# Patient Record
Sex: Male | Born: 1950 | Race: Black or African American | Hispanic: No | Marital: Married
Health system: Southern US, Community
[De-identification: ages and names within clinical notes are randomized; demographics above are authoritative.]

---

## 2009-02-22 ENCOUNTER — Ambulatory Visit: Payer: Self-pay | Admitting: Internal Medicine

## 2009-02-22 ENCOUNTER — Ambulatory Visit: Payer: Self-pay | Admitting: Family Medicine

## 2009-02-22 ENCOUNTER — Inpatient Hospital Stay (HOSPITAL_COMMUNITY): Admission: EM | Admit: 2009-02-22 | Discharge: 2009-03-01 | Payer: Self-pay | Admitting: Emergency Medicine

## 2009-02-23 ENCOUNTER — Ambulatory Visit: Payer: Self-pay | Admitting: Physical Medicine & Rehabilitation

## 2009-02-23 ENCOUNTER — Encounter (INDEPENDENT_AMBULATORY_CARE_PROVIDER_SITE_OTHER): Payer: Self-pay | Admitting: Emergency Medicine

## 2009-02-28 ENCOUNTER — Encounter: Payer: Self-pay | Admitting: Cardiovascular Disease

## 2009-03-01 ENCOUNTER — Ambulatory Visit: Payer: Self-pay | Admitting: Cardiovascular Disease

## 2009-03-01 ENCOUNTER — Inpatient Hospital Stay (HOSPITAL_COMMUNITY)
Admission: RE | Admit: 2009-03-01 | Discharge: 2009-03-15 | Payer: Self-pay | Admitting: Physical Medicine & Rehabilitation

## 2009-03-01 ENCOUNTER — Ambulatory Visit: Payer: Self-pay | Admitting: Physical Medicine & Rehabilitation

## 2009-04-12 ENCOUNTER — Telehealth: Payer: Self-pay | Admitting: Family Medicine

## 2009-04-19 ENCOUNTER — Encounter
Admission: RE | Admit: 2009-04-19 | Discharge: 2009-04-19 | Payer: Self-pay | Admitting: Physical Medicine & Rehabilitation

## 2010-11-23 ENCOUNTER — Ambulatory Visit: Payer: Self-pay | Admitting: Radiation Oncology

## 2010-11-29 ENCOUNTER — Ambulatory Visit: Payer: Self-pay | Admitting: Radiation Oncology

## 2010-12-06 ENCOUNTER — Ambulatory Visit: Payer: Self-pay | Admitting: Radiation Oncology

## 2011-01-06 ENCOUNTER — Ambulatory Visit: Payer: Self-pay | Admitting: Radiation Oncology

## 2011-01-14 LAB — BASIC METABOLIC PANEL
BUN: 17 mg/dL (ref 6–23)
Creatinine, Ser: 1.09 mg/dL (ref 0.4–1.5)
GFR calc non Af Amer: >60 mL/min (ref >60)

## 2011-01-15 LAB — DIFFERENTIAL
Basophils Absolute: 0 10*3/uL (ref 0.0–0.1)
Basophils Absolute: 0.1 10*3/uL (ref 0.0–0.1)
Basophils Relative: 1 % (ref 0–1)
Eosinophils Absolute: 0.1 10*3/uL (ref 0.0–0.7)
Eosinophils Absolute: 0.4 10*3/uL (ref 0.0–0.7)
Eosinophils Relative: 6 % — ABNORMAL HIGH (ref 0–5)
Lymphocytes Relative: 28 % (ref 12–46)
Lymphocytes Relative: 40 % (ref 12–46)
Lymphs Abs: 1.9 10*3/uL (ref 0.7–4.0)
Monocytes Absolute: 0.8 10*3/uL (ref 0.1–1.0)
Neutrophils Relative %: 60 % (ref 43–77)

## 2011-01-15 LAB — CARDIAC PANEL(CRET KIN+CKTOT+MB+TROPI)
CK, MB: 1.6 ng/mL (ref 0.3–4.0)
CK, MB: 1.9 ng/mL (ref 0.3–4.0)
CK, MB: 2 ng/mL (ref 0.3–4.0)
Relative Index: INVALID (ref 0.0–2.5)
Relative Index: INVALID (ref 0.0–2.5)
Relative Index: INVALID (ref 0.0–2.5)
Relative Index: INVALID (ref 0.0–2.5)
Total CK: 47 U/L (ref 7–232)
Total CK: 55 U/L (ref 7–232)
Total CK: 64 U/L (ref 7–232)
Troponin I: 0.03 ng/mL (ref 0.00–0.06)
Troponin I: 0.04 ng/mL (ref 0.00–0.06)
Troponin I: 0.06 ng/mL (ref 0.00–0.06)
Troponin I: 0.08 ng/mL — ABNORMAL HIGH (ref 0.00–0.06)

## 2011-01-15 LAB — CBC
Hemoglobin: 16.4 g/dL (ref 13.0–17.0)
MCHC: 34.6 g/dL (ref 30.0–36.0)
Platelets: 173 10*3/uL (ref 150–400)
Platelets: 174 10*3/uL (ref 150–400)
RBC: 4.47 MIL/uL (ref 4.22–5.81)
RBC: 4.55 MIL/uL (ref 4.22–5.81)
RDW: 13.5 % (ref 11.5–15.5)
RDW: 13.9 % (ref 11.5–15.5)
WBC: 7.1 10*3/uL (ref 4.0–10.5)
WBC: 7.3 10*3/uL (ref 4.0–10.5)

## 2011-01-15 LAB — COMPREHENSIVE METABOLIC PANEL
ALT: 26 U/L (ref 0–53)
ALT: 28 U/L (ref 0–53)
ALT: 45 U/L (ref 0–53)
AST: 46 U/L — ABNORMAL HIGH (ref 0–37)
Albumin: 3.3 g/dL — ABNORMAL LOW (ref 3.5–5.2)
Alkaline Phosphatase: 54 U/L (ref 39–117)
Alkaline Phosphatase: 65 U/L (ref 39–117)
Alkaline Phosphatase: 68 U/L (ref 39–117)
BUN: 13 mg/dL (ref 6–23)
BUN: 14 mg/dL (ref 6–23)
CO2: 22 mEq/L (ref 19–32)
CO2: 23 mEq/L (ref 19–32)
CO2: 27 mEq/L (ref 19–32)
Calcium: 9 mg/dL (ref 8.4–10.5)
Chloride: 100 mEq/L (ref 96–112)
GFR calc Af Amer: >60 mL/min (ref >60)
GFR calc non Af Amer: 59 mL/min — ABNORMAL LOW (ref >60)
GFR calc non Af Amer: >60 mL/min (ref >60)
GFR calc non Af Amer: >60 mL/min (ref >60)
Glucose, Bld: 104 mg/dL — ABNORMAL HIGH (ref 70–99)
Glucose, Bld: 146 mg/dL — ABNORMAL HIGH (ref 70–99)
Potassium: 3.5 mEq/L (ref 3.5–5.1)
Potassium: 3.8 mEq/L (ref 3.5–5.1)
Potassium: 4.4 mEq/L (ref 3.5–5.1)
Sodium: 134 mEq/L — ABNORMAL LOW (ref 135–145)
Sodium: 136 mEq/L (ref 135–145)
Sodium: 138 mEq/L (ref 135–145)
Total Bilirubin: 0.6 mg/dL (ref 0.3–1.2)
Total Bilirubin: 0.8 mg/dL (ref 0.3–1.2)
Total Protein: 7.8 g/dL (ref 6.0–8.3)

## 2011-01-15 LAB — PROTIME-INR
INR: 1.1 (ref 0.00–1.49)
Prothrombin Time: 14.2 seconds (ref 11.6–15.2)

## 2011-01-15 LAB — POCT I-STAT, CHEM 8
Glucose, Bld: 90 mg/dL (ref 70–99)
HCT: 51 % (ref 39.0–52.0)
Hemoglobin: 17.3 g/dL — ABNORMAL HIGH (ref 13.0–17.0)
Potassium: 4.6 mEq/L (ref 3.5–5.1)
Sodium: 140 mEq/L (ref 135–145)
TCO2: 23 mmol/L (ref 0–100)

## 2011-01-15 LAB — BASIC METABOLIC PANEL
BUN: 14 mg/dL (ref 6–23)
BUN: 16 mg/dL (ref 6–23)
BUN: 21 mg/dL (ref 6–23)
CO2: 26 mEq/L (ref 19–32)
CO2: 26 mEq/L (ref 19–32)
Calcium: 8.8 mg/dL (ref 8.4–10.5)
Calcium: 9 mg/dL (ref 8.4–10.5)
Calcium: 9.1 mg/dL (ref 8.4–10.5)
Chloride: 101 mEq/L (ref 96–112)
Chloride: 102 mEq/L (ref 96–112)
Creatinine, Ser: 1.08 mg/dL (ref 0.4–1.5)
Creatinine, Ser: 1.13 mg/dL (ref 0.4–1.5)
Creatinine, Ser: 1.16 mg/dL (ref 0.4–1.5)
Creatinine, Ser: 1.19 mg/dL (ref 0.4–1.5)
GFR calc Af Amer: >60 mL/min (ref >60)
GFR calc non Af Amer: >60 mL/min (ref >60)
GFR calc non Af Amer: >60 mL/min (ref >60)
Glucose, Bld: 103 mg/dL — ABNORMAL HIGH (ref 70–99)
Glucose, Bld: 116 mg/dL — ABNORMAL HIGH (ref 70–99)
Glucose, Bld: 95 mg/dL (ref 70–99)
Potassium: 4 mEq/L (ref 3.5–5.1)
Sodium: 136 mEq/L (ref 135–145)

## 2011-01-15 LAB — GLUCOSE, CAPILLARY
Glucose-Capillary: 212 mg/dL — ABNORMAL HIGH (ref 70–99)
Glucose-Capillary: 234 mg/dL — ABNORMAL HIGH (ref 70–99)

## 2011-01-15 LAB — DRUGS OF ABUSE SCREEN W/O ALC, ROUTINE URINE
Cocaine Metabolites: NEGATIVE
Creatinine,U: 93.9 mg/dL
Marijuana Metabolite: NEGATIVE
Methadone: NEGATIVE
Opiate Screen, Urine: NEGATIVE
Propoxyphene: NEGATIVE

## 2011-01-15 LAB — CK TOTAL AND CKMB (NOT AT ARMC): Total CK: 42 U/L (ref 7–232)

## 2011-01-15 LAB — LIPID PANEL
Cholesterol: 164 mg/dL (ref 0–200)
HDL: 30 mg/dL — ABNORMAL LOW (ref >39)

## 2011-01-15 LAB — TROPONIN I: Troponin I: 0.01 ng/mL (ref 0.00–0.06)

## 2011-01-15 LAB — TSH: TSH: 2.553 u[IU]/mL (ref 0.350–4.500)

## 2011-02-05 ENCOUNTER — Ambulatory Visit: Payer: Self-pay | Admitting: Radiation Oncology

## 2011-02-19 NOTE — H&P (Signed)
NAME:  Tommy Simpson, Tommy Simpson NO.:  [id number]   MEDICAL RECORD NO.:  [id number]          PATIENT TYPE:  INP   LOCATION:  4707                         FACILITY:  [facility]   PHYSICIAN:  Wayne A. Sheffield Slider, M.D.    DATE OF BIRTH:  02/15/1951   DATE OF ADMISSION:  02/22/2009  DATE OF DISCHARGE:                              HISTORY & PHYSICAL   PRIMARY CARE PHYSICIAN:  None.   CHIEF COMPLAINT:  Left-sided weakness.   HISTORY OF PRESENT ILLNESS:  This is a 60 year old male with no known  medical history who developed left leg and arm weakness.  He started  with a left lower extremity weakness yesterday morning.  By 7 p.m. last  night, he was dragging his left foot and by this morning he could barely  walk.  He also has left arm weakness at this time.  His left hand has  tingling.  He denies chest pain, shortness of breath, headache, and  vision problems now.  However, he is complaining that he had dizziness  and blurry vision when at work over the past couple of months.  The  episode of lower extremity weakness and arm weakness never happened  before.  He felt well until this point.  He has no physician and it has  been many years since he last saw one.   PAST MEDICAL HISTORY:  None.   ALLERGIES:  None.   MEDICATIONS:  None.   PAST SURGICAL HISTORY:  Hemorrhoid removal.   SOCIAL HISTORY:  He lives with his wife.  He smokes 1 pack a day and has  so for over 40 years.  He and his wifesplit four 48 ounce beers a day,  which is equivalent to 96 ounces of beer a day.  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  He does not use illegal drugs currently, however, used cocaine and  marijuana about 1-1/2 years ago.   FAMILY HISTORY:  His mother had a stroke in approximately age 60.  His  mother had hypertension, diabetes.  There is thyroid disease and high  cholesterol in his family.  There is no known coronary artery disease.   REVIEW OF SYSTEMS:  As in HPI as well as denies fevers, sweats,  headache,  edema, palpitations, cough, nausea, vomiting, diarrhea,  melena, abdominal pain, dysuria, rash, myalgias, arthralgias, numbness,  and polyuria.  He does complain of visual changes, weakness, dizziness.   PHYSICAL EXAMINATION:  VITAL SIGNS:  Temperature is 98.5, heart rate  ranges from 64-82, respiratory rate is 18.  Blood pressures are as  follows; 187/110, 185/95, 192/107, 173/103, currently it is 194/114.  O2  sat 98% on room air.  GENERAL:  Not in acute distress.  HEENT:  Pupils equal, round, and reactive to light.  He has arcus  senilis bilaterally.  He has a questionable cataract in his right eye.  Funduscopic exam shows arterial nicking.  No palpable edema.  He has  moist mucous membranes.  His extraocular muscles are intact.  NECK:  There is no bruise.  No JVD.  CARDIOVASCULAR:  Regular rate and rhythm.  No rubs, gallops, murmurs.  He has a normal PMI.  He has 1+ DP and radial pulses.  LUNGS:  Clear to auscultation bilaterally.  No increased work of  breathing.  ABDOMEN:  Soft, nontender.  Positive bowel sounds.  BACK:  Nontender.  GENITOURINARY:  Deferred.  EXTREMITIES:  There is no edema.  NEUROLOGIC:  Cranial nerves II through XII are intact including gag  reflex.  He has 4/5 left upper extremity strength and 3/5 left lower  extremity strength, otherwise the strength is normal.  He has difficulty  with alternating rapid hand movements.  He cannot perform finger-to-nose  secondary to weakness.  When assisted to standing, he leans towards the  left and he is unable to put full weight on his left lower extremity.  He is unable to perform Romberg because he could not lift his left hand  in front of him.  MUSCULOSKELETAL:  He does have full range of movement.   LABORATORY DATA AND STUDIES:  On I-STAT, sodium 140, potassium 4.5,  chloride 109, bicarb 23, BUN 18, creatinine 1.3, glucose 90.  PTT 29, PT  14.2, INR 1.1.  He has got a white count of 7.1, hemoglobin 16.6,   hematocrit 48, and platelets 173.  His head CT shows no acute findings.  He has periventricular white matter hypodensity, likely small vessel  ischemic change.  Chest x-ray shows hyperinflation with diffuse  prominent interstitial lung markings that suggest COPD.  He has left  pleural effusion versus chronic thickening.   ASSESSMENT:  A 60 year old male with left-sided weakness.  1. Weakness:  Likely a stroke.  Consider hypertensive emergency with      encephalopathy; however, not unlikely given gradual onset and      duration of this.  He is outside the window for thrombolytics for      stroke.  We will need a CVA workup, which include MRI/MRA of the      head and neck to look for embolic, ischemic CVA.  Likely this is a      lacunar stroke with motor hemiplegia.  We are going to check a 2-D      echo for embolic source.  We will check a TSH.  We are going to      start aspirin 325 as he has not had a trial on aspirin.  We will      put him on a tele bed.  We will arrange for PT/OT for      strengthening.  We will need start a statin for plaque control.  We      will check a fasting lipid panel, however, continue the statin      regardless of his LDL.  We will risk stratify him.  He does not      need carotid Dopplers as an MRI of the neck will assess his      carotids.  2. Hypertension:  It is unlikely this is a hypertensive emergency.      Gradual lowering of his blood pressure is indicated to prevent low      blood flow if this is indeed an ischemic stroke.  His goal systolic      blood pressure is 155-170.  I gave him hydralazine 10 mg for now.      We will start hydrochlorothiazide daily and add p.r.n. meds that      can be added later.  We will check a TSH, cardiac enzymes, and  fasting lipid panel.  3. EKG changes.  He had flipped T-waves in every lead with ST      elevation in 1 lead.  We are going to cycle his cardiac enzymes x3      and check an EKG in the morning.  4.  Alcohol abuse:  There are no signs of withdrawal and CIWA score is      0.  We have advised the patient to notify us if he has signs of      withdrawal as we have a low threshold for Ativan.  5. Tobacco abuse.  He requested smoking cessation.  6. Prophylaxis with heparin.  There is no need for a PPI.   DISPOSITION:  Pending CVA workup.  He will need a primary care physician  upon discharge.      Johney Maine, M.D.  Electronically Signed      Wayne A. Sheffield Slider, M.D.  Electronically Signed    JT/MEDQ  D:  02/23/2009  T:  02/24/2009  Job:  [id number]

## 2011-02-19 NOTE — Discharge Summary (Signed)
NAME:  Tommy Simpson, FINIGAN NO.:  [id number]   MEDICAL RECORD NO.:  [id number]          PATIENT TYPE:  IPS   LOCATION:  4034                         FACILITY:  [facility]   PHYSICIAN:  Ranelle Oyster, M.D.DATE OF BIRTH:  Feb 25, 1951   DATE OF ADMISSION:  03/01/2009  DATE OF DISCHARGE:                               DISCHARGE SUMMARY   DISCHARGE DIAGNOSES:  1. Right subcortical cerebrovascular accident.  2. Subcutaneous Lovenox for deep vein thrombosis prophylaxis.  3. Hypertension.  4. Hyperlipidemia.  5. Tobacco abuse.   This is a 60 year old African American male with history of tobacco  abuse admitted on Feb 22, 2009, with left-sided weakness.  MRI showed  acute infarction, right lenticular nucleus - external capsule.  MRA with  atherosclerotic changes.  MRA of the neck was without significant  stenosis.  Echocardiogram with ejection fraction of 40-45%.  TEE without  emboli.  Blood pressure monitored with increased variables, on  hydrochlorothiazide, Lopressor, and lisinopril; aspirin and subcutaneous  Lovenox was added for stroke prophylaxis.  Cholesterol of 164 with  Crestor added for hyperlipidemia.  On Feb 25, 2009, with questionable  increased left-sided weakness, cranial CT scan showing no changes.  Therapies ongoing, minimal assistance for ambulation.  He was seen by  Inpatient Rehab Services and felt to be a good candidate for  comprehensive rehab program.   PAST MEDICAL HISTORY:  See discharge diagnoses.  Occasional alcohol.  He  smokes 1 to 1-1/2 packs per day.   SOCIAL HISTORY:  He lives with his wife in [place], 1-level home, 4  steps to entry.  He works as a Financial risk analyst at [employer].  Family  to assist as needed on discharge.   FUNCTIONAL HISTORY:  He was independent prior to admission.   FUNCTIONAL STATUS UPON ADMISSION TO REHAB SERVICES:  He was minimal  assist for ambulation as well as transfers.  He was on no medications  prior to  admission.   ALLERGIES:  None.   PHYSICAL EXAMINATION:  VITAL SIGNS:  Blood pressure 139/88, pulse 54,  temperature 97.6, and respirations 18.  NEUROLOGIC:  This was an alert male in no acute distress, oriented x3.  Deep tendon reflexes 2+.  Sensation decreased to light touch on the  left.  Calves remained cool without any swelling or erythema and  nontender.  LUNGS:  Decreased breath sounds.  Clear to auscultation.  CARDIAC:  Regular rate and rhythm.  ABDOMEN:  Soft and nontender.  Good bowel sounds.   REHABILITATION HOSPITAL COURSE:  The patient was admitted to Inpatient  Rehab Services with therapies initiated on a 3-hour daily basis  consisting of physical therapy, occupational therapy, and rehabilitation  nursing.  The following issues were addressed during the patient's  rehabilitation stay.  Pertaining to Mr. Gaza right subcortical  cerebrovascular accident, he remained on aspirin therapy.  Subcutaneous  Lovenox ongoing for deep vein thrombosis prophylaxis.  Blood pressures  monitored with some increased variables and his lisinopril had been  increased to 30 mg on March 13, 2009, as well as Toprol to 50 mg on March 10, 2009.  His Lopressor had been discontinued on March 09, 2009.  He  remained on Crestor for hyperlipidemia.  He did have a history of  tobacco use.  It was discussed at length the need for cessation of  smoking.  It was questionable if he will be compliant with these  requests.  The patient will receive weekly collaborative  interdisciplinary team conferences to discuss estimated length of stay,  any barriers to discharge, and ongoing family teaching.  He was close  supervision to minimal assist for ambulating 150 feet with rolling  walker, minimal assist 12 steps, supervision transfers, steady assist  for dynamic balance.  He was continent of bowel and bladder.  He was  discharged to home.   LATEST LABORATORY DATA:  Sodium 136, potassium 4.2, BUN 21, and   creatinine 1.2.   DISCHARGE MEDICATIONS AT TIME OF DICTATION:  1. Crestor 10 mg daily.  2. Aspirin 81 mg daily.  3. Celexa 10 mg daily.  4. Hydrochlorothiazide 12.5 mg daily.  5. Niacin 250 mg daily.  6. Toprol-XL 50 mg daily.  7. Lisinopril 30 mg daily.  8. Tylenol as needed.   DIET:  Regular.   SPECIAL INSTRUCTIONS:  No drinking, no driving, no smoking.  Follow up  with Dr. Faith Rogue at the Outpatient Rehab Service office as  advised.  Dr. Eden Emms of Cardiology Services, call for appointment and  Dr. Sheffield Slider medical management.      Mariam Dollar, P.A.      Ranelle Oyster, M.D.  Electronically Signed    DA/MEDQ  D:  03/14/2009  T:  03/14/2009  Job:  [id number]   cc:   Deniece Portela A. Sheffield Slider, M.D.  Pramod P. Pearlean Brownie, MD  Noralyn Pick Eden Emms, MD, [facility]

## 2011-02-19 NOTE — Consult Note (Signed)
NAME:  Tommy Simpson, Tommy Simpson NO.:  [id number]   MEDICAL RECORD NO.:  [id number]          PATIENT TYPE:  INP   LOCATION:  4702                         FACILITY:  [facility]   PHYSICIAN:  Noralyn Pick. Eden Emms, MD, FACCDATE OF BIRTH:  1951-08-26   DATE OF CONSULTATION:  02/28/2009  DATE OF DISCHARGE:                                 CONSULTATION   A 60 year old patient we are asked to do a transesophageal  echocardiogram on for question of a bicuspid aortic valve, rule out  source of embolus in the setting of a TIA and CVA.  The patient was  sedated with 3 mg of Versed and 50 mcg of fentanyl.   Using digital technique, an Omniplane probe was advanced to the distal  esophagus without incident.   Transgastric imaging revealed moderate left ventricular cavity  enlargement with mild LVH, so the EF was 35-40%.  The distal septum,  apex, and inferior walls were hypokinetic.  There was no mural apical  thrombus.  Mitral valve was mildly thickened without significant MR.  There was mild left atrial enlargement and mild right atrial  enlargement.  Right ventricle was normal.  There was no ASD or PFO.  Bubble study was negative for shunt.  Aortic valve was trileaflet and it  was not bicuspid.  There was trivial aortic insufficiency.  There was  mild aortic valve sclerosis.  Left atrial appendage well visualized  without spontaneous contrast or thrombus.  The aorta had moderate mural  plaque, but no mobile debris.   IMPRESSION:  1. Moderate left ventricular cavity enlargement, ejection fraction 35-      40%, and septal apical inferior wall hypokinesis.  No mural apical      thrombus.  2. No source of embolus.  3. Negative bubble study.  4. Trileaflet aortic valve with aortic sclerosis and trivial aortic      insufficiency.  5. No left atrial appendage thrombus.  6. Moderate mural aortic debris, which is nonmobile.   The patient tolerated the procedure well.      Noralyn Pick. Eden Emms,  MD, [facility]  Electronically Signed     PCN/MEDQ  D:  02/28/2009  T:  02/28/2009  Job:  [id number]

## 2011-02-19 NOTE — Discharge Summary (Signed)
NAME:  Tommy Simpson, Tommy Simpson NO.:  [id number]   MEDICAL RECORD NO.:  [id number]          PATIENT TYPE:  INP   LOCATION:  4702                         FACILITY:  [facility]   PHYSICIAN:  Leighton Roach McDiarmid, M.D.DATE OF BIRTH:  May 22, 1951   DATE OF ADMISSION:  02/22/2009  DATE OF DISCHARGE:  03/01/2009                               DISCHARGE SUMMARY   PRIMARY CARE PHYSICIAN:  The patient is unassigned   DISCHARGE DIAGNOSES:  1. Right cerebrovascular accident with left-sided hemiparesis  2. Left Ventricular systolic dysfunction with ejection fraction of 40-      45%.  3. Hypertension.  4. Hyperlipidemia.  5. Tobacco abuse.  6. Non-sustained, asymptomatic ventricular tachycardia   DISCHARGE MEDICATIONS:  1. Aspirin 81 mg p.o. daily.  2. Simvastatin 80 mg p.o. at bedtime.  3. Nystatin 500 mg p.o. daily x1 week, then 1000 mg p.o. daily x2      weeks, then 1500 mg p.o. daily x2 weeks then 2000 mg p.o. daily,      take this medicine 30 minutes after taking aspirin.  4. Hydrochlorothiazide 12.5 mg p.o. daily.  5. Lisinopril 20 mg p.o. daily.  6. Metoprolol 12.5 mg p.o. twice daily.  7. Celexa 10 mg p.o. once daily.   CONSULTATIONS:  1. Cardiology was consulted.  2. Physical Therapy.  3. Rehabilitative Therapy.   PERTINENT RESULTS:  1. TEE show that the patient had moderate left ventricular cavity      enlargement, ejection fraction between 35-40%, septal apical      inferior wall hypokinesis.  No mural apical thrombus.  No source of      embolus.  A negative bubble study.  Trileaflet aortic valve with      aortic sclerosis and trivial aortic insufficiency.  No left atrial      appendage thrombus.  No moderate mural aortic debris which is      nonmobile.  2. Echo on Feb 23, 2009, that showed systolic function with mildly to      moderately reduced.  Estimated ejection fraction between 40-45%.      Moderate left ventricular hypertrophy.  3. Chest x-ray on Feb 22, 2009,  that showed hyperinflation with      diffusely prominent interstitial lung markings that may represent      COPD.  Left pleural effusion versus chronic thickening.  4. CT of the head without contrast on Feb 22, 2009, that showed no      acute intracranial findings.  5. MRI of the brain with and without contrast on Feb 22, 2009, that      showed atherosclerotic-type changes without hemodynamically      significant stenosis involving either carotid bifurcation.  6. MRA of neck with and without contrast on Feb 22, 2009, that showed      atherosclerotic-type changes without hemodynamically significant      stenosis involving the carotid bifurcation.  7. Head CT without contrast on Feb 25, 2009, that showed no      intracranial hemorrhage.  CT evidence of new large acute infarct.8  8. Hemoglobin A1c of 5.4%.  9. BMET  within normal limits.  10.CBC that showed white blood cell count of 7.2, hemoglobin 16.4,      hematocrit 47.4, platelets 157, MCV of 104.1.  11.Serial cardiac enzymes that showed an initial bump of troponin from      0.05 five on Feb 24, 2009, which then bumped to 0.08, which then      trended down again to 0.06 and 0.04.  12.Normal liver function with AST of 29, ALT of 26, with a mildly      decreased albumin of 3.2.  13.Drug screen abuse was negative.  14.Fasting lipid panel that showed total cholesterol 164, triglyceride      100, HDL 30, LDL 114.  15.TSH of 2.553.   BRIEF HOSPITAL COURSE:  This is a 60 year old male who experienced a  large right-sided CVA leading to left-sided hemiparesis.  1. Right-sided CVA.  The patient was immediately placed on a loading      dose of aspirin, Lovenox, and low-dose beta-blocker on admission.      The patient was also placed on a statin.  We kept the patient's      blood pressure high with no more than 20% decreased from admission      blood pressure with concerns for increased risk of vascular bleed      should the patient's blood  pressure be brought down to low too      quickly after stroke.  The patient saw Physical Therapy and      Occupational Therapy following the admission.  He worked with PT      and OT very well to make improvement with left-sided function on a      daily basis.  On overnight of Feb 26, 2009, the patient had runs of      V-tach.  Cardiac enzymes once again were ordered with the initial      set within normal limits and second set with a minimal bump of the      troponin.  Cardiology was consulted to see the patient regarding      this initial bump.  An additional CAT scan of the head was also      ordered, which did not show any changes from previous.  Cardiology      evaluated the patient and recommended a TEE.  The TEE show that the      patient had moderate left ventricular cavity enlargement, ejection      fraction between 35-40%, septal apical inferior wall hypokinesis.      Cardiology recommended the patient have good control of his blood      pressure including a beta-blocker and ACE inhibitor and      hydrochlorothiazide.  The patient is to continue on his aspirin.      There is no further workup needed by Cardiology.  The patient is      discharged to [facility] for further rehabilitation.  2. Hypertension.  The patient is to continue on hydrochlorothiazide      12.5 mg/lisinopril 20 mg p.o. daily.  This medication is on the 4-      dollar formulary at [facility] so will be affordable.  The patient is      also to continue on a low-dose metoprolol 12.5 p.o. b.i.d.  The      patient's blood pressure on day of discharge has a systolic      pressure between 128-167.  3. Hyperlipidemia.  The patient to continue on simvastatin 80  mg p.o.      daily.  His fasting lipid show an LDL of 114 and HDL of 30.  The      patient also to continue on niacin that was started during this      hospitalization.  The patient did not have any flushing complaint      status post starting on  niacin.  4. Tobacco abuse.  Smoking cessation was initiated during this      hospitalization.   DISCHARGE INSTRUCTIONS:  1. Activity.  The patient is to walk with assistance.  Activity is to      be monitored and regulated by [facility].  2. Diet.  Low-sodium heart-healthy diet.  3. Wound care not applicable.   FOLLOWUP APPOINTMENTS:  1. The patient is to return to the [facility] to see Dr.      Lafonda Mosses on March 28, 2009, at 8:15.  2. The patient is to return to [facility] for followup care.      They will call him with an appointment.   The patient is discharged in stable condition to [facility].      Angeline Slim, MD  Electronically Signed      Leighton Roach McDiarmid, M.D.  Electronically Signed    CT/MEDQ  D:  03/03/2009  T:  03/04/2009  Job:  [id number]

## 2011-02-19 NOTE — H&P (Signed)
NAME:  Tommy Simpson, Tommy Simpson NO.:  [id number]   MEDICAL RECORD NO.:  [id number]          PATIENT TYPE:  IPS   LOCATION:  4034                         FACILITY:  [facility]   PHYSICIAN:  Ranelle Oyster, M.D.DATE OF BIRTH:  01-15-1951   DATE OF ADMISSION:  03/01/2009  DATE OF DISCHARGE:                              HISTORY & PHYSICAL   CHIEF COMPLAINT:  Left-sided weakness.   PRIMARY CARE PHYSICIAN:  Wayne A. Sheffield Slider, MD   NEUROLOGIST:  Pramod P. Pearlean Brownie, MD   HISTORY OF PRESENT ILLNESS:  This is a pleasant 60 year old African  American male with tobacco abuse admitted on Feb 22, 2009, with left-  sided weakness.  MRI was positive for right lenticular nucleus infarct  as well as external capsule infarct.  MRA showed atherosclerotic  changes.  MRA was not notable for any significant stenosis.  Echocardiogram was significant for ejection fraction of 40-45%.  Bubble  study negative.  Blood pressures has been variable and  hydrochlorothiazide, lisinopril, and Lopressor have been added.  The  patient is on aspirin and subcu Lovenox for anticoagulation.  Crestor  was added for hyperlipidemia.  The patient continues to have left-sided  weakness affecting his gait and balance as well as self-care.  Rehab was  consulted on Feb 23, 2009, and felt that he could benefit from an  admission and ultimately he was brought today.   REVIEW OF SYSTEMS:  Notable for occasional headache.  Otherwise he  denies any pain.  Other pertinent positives are above and full 14-point  review is in the written H&P.   PAST MEDICAL HISTORY:  Positive for hemorrhoid surgery, borderline  hypertension, tobacco use.  He occasionally drinks.   FAMILY HISTORY:  Positive for diabetes and stroke.   SOCIAL HISTORY:  The patient lives with his wife in [place].  They  have one level house with 4 steps to enter.  He works as a Financial risk analyst at  [employer].  His wife works there with him as well.  He  smokes  occasionally.  She will be able to assist at home as well as  family.   ALLERGIES:  None.   HOME MEDICATIONS:  None.   LABORATORY DATA:  Hemoglobin 16.4, white count 7.2, platelets 167.  Sodium 136, potassium 4.1, BUN 18, creatinine 1.19.   PHYSICAL EXAMINATION:  VITAL SIGNS:  Blood pressure 139/88, pulse is 54,  respiratory rate 18, temperature 97.6.  GENERAL:  The patient is pleasant, alert, and oriented x3.  HEENT:  Pupils equal, round, and reactive to light.  Ear, nose, and  throat exam is notable for borderline dentition, otherwise pink mucosa  and was moist.  NECK:  Supple without JVD or lymphadenopathy.  CHEST: Clear to auscultation bilaterally without wheezes, rales, or  rhonchi.  HEART:  Regular rate and rhythm without murmurs, rubs, or gallops.  ABDOMEN:  Soft, nontender.  Bowel sounds are positive.  SKIN:  Generally intact throughout.  NEUROLOGIC:  Cranial nerves II through XII showed mild central VII.  The  patient was mildly dysarthric.  Reflexes are 1+ throughout.  Sensation  is  intact in all 4 limbs, though difference right to left.  Strength was  diminished in the left upper extremity at 2/5 shoulder, elbow, and  wrist.  Hand intrinsics were 1+-2/5.  Left lower extremity strength was  2+-3/5 in the hip and knee.  The ankle movement was trace out of five  ankle dorsiflexion, perhaps trace to one ankle plantar flexion.  No  notable spasticity was seen.  Cognitively, the patient was appropriate  and very pleasant.   POSTADMISSION PHYSICIAN EVALUATION:  1. Functional deficits secondary to right subcortical stroke in the      external capsule as well as the lenticular nucleus.  The patient      with left hemiparesis.  2. The patient is admitted to receive collaborative interdisciplinary      care between the physiatrist, rehab nursing staff, and therapy      team.  3. The patient's level of medical complexity and substantial therapy      needs in context of that  medical necessity cannot be provided at a      lesser intensity of care.  4. The patient has experienced substantial functional loss from his      baseline.  Upon functional assessment at the time of preadmission      screening, the patient was min assist for basic bed mobility and      transfers, mod assist ambulation to 40 feet.  The patient      progressed 120 feet min assist with rolling walker ambulation, min      assist transfers, min assist bed mobility, and min to mod assist      lower body ADLs.  Based on the patient's diagnosis, physical exam,      and functional history, he has a potential for functional progress,      which will result in measurable gains while in inpatient rehab.      These gains will be of substantial and practical use upon discharge      to home in facilitating mobility and self-care.  Interim changes in      medical status since preadmission screening on Feb 23, 2009, are      detailed above.  5. Physiatrist will provide 24-hour management of medical needs as      well as oversight of the therapy plans/treatment and provide      guidance as appropriate regarding interaction of the two.  Medical      problem list and plan are listed below.  6. 24-hour rehab nursing will assist in management of the patient's      bowel and bladder function as well as nutrition, skin care, and      integration of therapy concepts and techniques.  7. PT will assess and treat for lower extremity strength,      coordination, adaptive equipment, orthotics, functional mobility,      gait, and balance.  The patient likely will need a left AFO.  Goals      are modified independent.  8. OT will assess and treat for upper extremity use, ADLs, adaptive      equipment, neuromuscular reeducation, safety awareness, and the      patient/family education with goals supervision to modified      independent.  Perhaps occasional min assist.  9. Speech language pathology will follow for  dysarthria and treat as      appropriate.  10.Case management and social worker will assess and treat for  psychosocial issues and discharge planning.  11.Team conference will be held weekly to assess progress towards      goals and to determine barriers to discharge.  12.The patient has demonstrated sufficient medical stability and      exercise capacity to tolerate at least 3 hours of therapy per day      at least 5 days per week.  13.Estimated length of stay is 7+ days.  Prognosis is good.   MEDICAL PROBLEM LIST AND PLAN:  1. Hypertension:  This remains labile on hydrochlorothiazide,      lisinopril, and Lopressor.  We will add p.r.n. clonidine for      coverage of increased blood pressure.  2. Mood:  Celexa 10 mg p.o. daily.  The patient does not appear      depressed to me.  3. Stroke prophylaxis with niacin and Crestor to control lipids as      well as aspirin 81 mg p.o. daily.  4. DVT prophylaxis:  Subcu Lovenox 40 mg daily for now, watch      platelets for bleeding complications.  5. Tobacco abuse:  The patient requires counseling.  He seems      committed to stopping however.      Ranelle Oyster, M.D.  Electronically Signed     ZTS/MEDQ  D:  03/01/2009  T:  03/02/2009  Job:  [id number]   cc:   Deniece Portela A. Sheffield Slider, M.D.  Pramod P. Pearlean Brownie, MD

## 2011-02-19 NOTE — Consult Note (Signed)
NAME:  Tommy Simpson, Tommy Simpson NO.:  [id number]   MEDICAL RECORD NO.:  [id number]          PATIENT TYPE:  INP   LOCATION:  4702                         FACILITY:  [facility]   PHYSICIAN:  Pricilla Riffle, MD, FACCDATE OF BIRTH:  November 30, 1950   DATE OF CONSULTATION:  02/27/2009  DATE OF DISCHARGE:                                 CONSULTATION   PRIMARY CARDIOLOGIST:  Halina Andreas, MD, [facility]   PRIMARY CARE PHYSICIAN:  The patient does not have one.   We were being asked to see the patient by Teaching Service.   REASON FOR CONSULTATION:  Questionable V-tach, rule out underlying CAD  and questionable need for TEE.   HISTORY OF PRESENT ILLNESS:  This is a 60 year old African American male  without prior cardiac history and has not been followed by family  physician for over 30 years who was admitted with a CVA after  experiencing left-sided weakness occurring over 2 days.  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  He  ignored this and continued to work, went to bed Tuesday night, the  following morning when he woke up, he was unable to control his left  side completely.  He felt drunk and had no coordination.  His wife  called EMS.  The patient admits to some blurred vision and dizziness for  years while at work and sitting down tends to make it feel better.  The  patient denied any palpitations, chest pain, nausea, vomiting, or  diaphoresis associated.   The patient now continues to have some left-sided weakness and primary  care is planning for rehab on discharge, either inpatient or through  skilled nursing facility patient rehab, and they are requesting a  cardiac evaluation first and need for possible TEE or other cardiac  workup prior to discharge.   REVIEW OF SYSTEMS:  Positive for  blurred vision and dizziness for years,  left-sided weakness, progressive over a 24-hour period approximately 7  days ago with left-sided weakness and incoordination.  All other systems  are reviewed and found to be negative.   PAST MEDICAL HISTORY:  Hypertension, but this has been untreated until  now and ongoing tobacco abuse.  Echocardiogram completed on the May 19,  revealed an EF of 40%-45% with diastolic dysfunction, bicuspid aortic  valve could not be excluded, moderate stenosis, mildly calcified  leaflets, mild LVH.   PAST SURGICAL HISTORY:  Hemorrhoidectomy.   SOCIAL HISTORY:  He lives in [place] with his wife.  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  He is a 40-pack-year smoker, drinks beer  daily and cocaine use in the past.   FAMILY HISTORY:  Mother with CVA at age 66 with hypertension and  diabetes with a history of hypertension in other family members.   CURRENT MEDICATIONS:  1. Aspirin 325 daily.  2. Celexa 10  mg daily.  3. Hydrochlorothiazide 25 mg daily.  4. Metoprolol 12.5 mg daily.  5. Niacin 250 daily.  6. Crestor 10 mg daily.  7. Hydralazine 10 mg IV p.r.n.   ALLERGIES:  No known drug allergies.   CURRENT LABORATORY:  Sodium 136, potassium 4.1, chloride 101, CO2 of 26,  BUN 18, creatinine 1.19, glucose 95.  Hemoglobin 16.4, hematocrit 47.4,  white blood cells 7.2, platelets 167, total bili 0.8, alkaline  phosphatase 68, AST 34, ALT 28, total protein 7.8, albumin 3.2,  hemoglobin A1c 5.4, troponin 0.05, 0.08, and 0.06 respectively.  Urine  drug screen negative.  EtOH negative.  Calcium 9.1.  EKG revealing sinus  rhythm at a rate of 64 beats per minute with hypertrophy and inferior Q-  waves with T-wave inversion laterally.  CT scan revealed any evidence of  new, large right lacunar infarct.   PHYSICAL EXAMINATION:  VITAL SIGNS:  Blood pressure 132/76 (range 132-  159 systolic over 76-94), heart rate 56 with a range of 50-90,  temperature  97.5, O2 sat 98% room air.  GENERAL:  He is awake, alert, and oriented.  He has a good affect and is  responding appropriately.  HEENT:  Normocephalic and atraumatic.  Eyes:  PERRLA.  Mucous membranes  of the mouth are pink and moist.  Tongue is midline.  NECK:  Supple.  There is no JVD or carotid bruits appreciated.  CARDIOVASCULAR:  Regular rate and rhythm with 2/6 systolic murmur.  Pulses are 2+ and equal without bruits.  LUNGS:  Clear to auscultation without wheezes, rales, or rhonchi.  SKIN:  Warm and dry.  ABDOMEN:  Soft, nontender with 2+ bowel sounds.  EXTREMITIES:  Without clubbing, cyanosis, or edema.  NEURO:  Left-sided weakness is noted.  There were some movement in his  left arm but left leg remains placid.   IMPRESSION:  1. Acute right cerebrovascular accident with left-sided hemiparesis.  2. Hypertension.  3. 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  4. Renal insufficiency, mild.  5. Rule out bicuspid aortic valve versus aortic valve disease.  6. Wide complex tachycardia, irregular rule out atrial fibrillation.   PLAN:  This is a 60 year old African American male without prior cardiac  history admitted with right lacunar infarct and left-sided weakness with  evidence of mixed cardiomyopathy with an EF of 40%-45%, hypertension,  questionable bicuspid aortic valve.  The patient has been seen and  examined by Dr. Dietrich Pates and myself.   We have noted a trivial bump in troponin.  The patient is not a  candidate for invasive evaluation given cerebrovascular accident, may  consider noninvasive workup to establish risk for rehab questionable  timing, whether this should be done sooner or later.  Right now, we  would defer and plan for a possible outpatient stress Myoview.  In the  interim, the patient's medications will be adjusted secondary to some  mild renal insufficiency.  We will discontinue  hydrochlorothiazide to  avoid any problems with electrolytes and renal insufficiency and begin  Norvasc 2.5 mg daily.  Also, the patient will be scheduled for a TEE in  the morning to evaluate for cardiac etiology of cerebrovascular  accident.  Also, we will have strips of a nonsustained wide complex  tachycardia reviewed by EPS, they appear to be irregular and want to  rule out atrial fibrillation and continue monitor as an outpatient if  necessary,  concerning especially  for his dizziness as an outpatient.  We will  follow along making further recommendations based upon test results.  On  behalf of the physicians and providers of [facility], we would  like to thank the Teaching Service for allowing [place] participate in the  care of this patient.      Bettey Mare. Lyman Bishop, NP      Pricilla Riffle, MD, [facility]  Electronically Signed    KML/MEDQ  D:  02/27/2009  T:  02/28/2009  Job:  [id number]

## 2011-03-08 ENCOUNTER — Ambulatory Visit: Payer: Self-pay | Admitting: Radiation Oncology

## 2011-04-07 ENCOUNTER — Ambulatory Visit: Payer: Self-pay | Admitting: Radiation Oncology

## 2011-05-08 ENCOUNTER — Ambulatory Visit: Payer: Self-pay | Admitting: Radiation Oncology

## 2011-08-08 ENCOUNTER — Ambulatory Visit: Payer: Self-pay | Admitting: Radiation Oncology

## 2011-09-07 ENCOUNTER — Ambulatory Visit: Payer: Self-pay | Admitting: Radiation Oncology

## 2012-03-12 ENCOUNTER — Ambulatory Visit: Payer: Self-pay | Admitting: Radiation Oncology

## 2012-04-06 ENCOUNTER — Ambulatory Visit: Payer: Self-pay | Admitting: Radiation Oncology

## 2013-03-09 ENCOUNTER — Ambulatory Visit: Payer: Self-pay | Admitting: Radiation Oncology

## 2013-04-06 ENCOUNTER — Ambulatory Visit: Payer: Self-pay | Admitting: Radiation Oncology

## 2013-09-06 ENCOUNTER — Inpatient Hospital Stay: Payer: Self-pay | Admitting: Internal Medicine

## 2013-09-06 LAB — MAGNESIUM: Magnesium: 2.4 mg/dL

## 2013-09-06 LAB — IRON AND TIBC
Iron Bind.Cap.(Total): 182 ug/dL — ABNORMAL LOW (ref 250–450)
Iron Saturation: 46 %
Iron: 83 ug/dL (ref 65–175)
Unbound Iron-Bind.Cap.: 99 ug/dL

## 2013-09-06 LAB — CBC WITH DIFFERENTIAL/PLATELET
Basophil: 2 %
HCT: 41.3 % (ref 40.0–52.0)
HGB: 14.2 g/dL (ref 13.0–18.0)
Lymphocytes: 6 %
MCHC: 34.3 g/dL (ref 32.0–36.0)
MCV: 105 fL — ABNORMAL HIGH (ref 80–100)
Monocytes: 6 %
Platelet: 226 10*3/uL (ref 150–440)
RBC: 3.92 10*6/uL — ABNORMAL LOW (ref 4.40–5.90)

## 2013-09-06 LAB — BASIC METABOLIC PANEL
Anion Gap: 10 (ref 7–16)
Co2: 20 mmol/L — ABNORMAL LOW (ref 21–32)
EGFR (African American): 21 — ABNORMAL LOW
EGFR (Non-African Amer.): 18 — ABNORMAL LOW
Glucose: 114 mg/dL — ABNORMAL HIGH (ref 65–99)
Potassium: 2.4 mmol/L — CL (ref 3.5–5.1)
Sodium: 136 mmol/L (ref 136–145)

## 2013-09-06 LAB — PROTIME-INR
INR: 1.4
Prothrombin Time: 17.1 secs — ABNORMAL HIGH (ref 11.5–14.7)

## 2013-09-06 LAB — LACTATE DEHYDROGENASE: LDH: 402 U/L — ABNORMAL HIGH (ref 85–241)

## 2013-09-06 LAB — URINALYSIS, COMPLETE
Blood: NEGATIVE
Nitrite: NEGATIVE
Ph: 6 (ref 4.5–8.0)
Protein: NEGATIVE
Specific Gravity: 1.013 (ref 1.003–1.030)
WBC UR: 9 /HPF (ref 0–5)

## 2013-09-06 LAB — SODIUM, URINE, RANDOM: Sodium, Urine Random: 33 mmol/L (ref 20–110)

## 2013-09-06 LAB — DRUG SCREEN, URINE
Barbiturates, Ur Screen: NEGATIVE (ref ?–200)
Cannabinoid 50 Ng, Ur ~~LOC~~: NEGATIVE (ref ?–50)
Cocaine Metabolite,Ur ~~LOC~~: NEGATIVE (ref ?–300)
MDMA (Ecstasy)Ur Screen: NEGATIVE (ref ?–500)
Methadone, Ur Screen: NEGATIVE (ref ?–300)
Opiate, Ur Screen: NEGATIVE (ref ?–300)
Tricyclic, Ur Screen: NEGATIVE (ref ?–1000)

## 2013-09-06 LAB — TROPONIN I: Troponin-I: 0.05 ng/mL

## 2013-09-06 LAB — HEPATIC FUNCTION PANEL A (ARMC)
Alkaline Phosphatase: 706 U/L — ABNORMAL HIGH
SGPT (ALT): 283 U/L — ABNORMAL HIGH (ref 12–78)

## 2013-09-06 LAB — ETHANOL: Ethanol %: <0.003 % (ref 0.000–0.080)

## 2013-09-06 LAB — ACETAMINOPHEN LEVEL: Acetaminophen: <2 ug/mL

## 2013-09-06 LAB — FERRITIN: Ferritin (ARMC): 2378 ng/mL — ABNORMAL HIGH (ref 8–388)

## 2013-09-07 DIAGNOSIS — I359 Nonrheumatic aortic valve disorder, unspecified: Secondary | ICD-10-CM

## 2013-09-07 LAB — COMPREHENSIVE METABOLIC PANEL
Albumin: 1.5 g/dL — ABNORMAL LOW (ref 3.4–5.0)
Anion Gap: 12 (ref 7–16)
BUN: 68 mg/dL — ABNORMAL HIGH (ref 7–18)
Chloride: 111 mmol/L — ABNORMAL HIGH (ref 98–107)
Co2: 17 mmol/L — ABNORMAL LOW (ref 21–32)
Creatinine: 2.66 mg/dL — ABNORMAL HIGH (ref 0.60–1.30)
Osmolality: 299 (ref 275–301)
Potassium: 3.3 mmol/L — ABNORMAL LOW (ref 3.5–5.1)
SGPT (ALT): 272 U/L — ABNORMAL HIGH (ref 12–78)
Sodium: 140 mmol/L (ref 136–145)
Total Protein: 6.8 g/dL (ref 6.4–8.2)

## 2013-09-07 LAB — CBC WITH DIFFERENTIAL/PLATELET
HCT: 36.8 % — ABNORMAL LOW (ref 40.0–52.0)
HGB: 12.6 g/dL — ABNORMAL LOW (ref 13.0–18.0)
Lymphocytes: 14 %
MCH: 36 pg — ABNORMAL HIGH (ref 26.0–34.0)
MCHC: 34.3 g/dL (ref 32.0–36.0)
MCV: 105 fL — ABNORMAL HIGH (ref 80–100)
Monocytes: 8 %
Platelet: 208 10*3/uL (ref 150–440)
RBC: 3.5 10*6/uL — ABNORMAL LOW (ref 4.40–5.90)
RDW: 18.8 % — ABNORMAL HIGH (ref 11.5–14.5)
Segmented Neutrophils: 78 %
WBC: 13.2 10*3/uL — ABNORMAL HIGH (ref 3.8–10.6)

## 2013-09-07 LAB — TSH: Thyroid Stimulating Horm: 0.03 u[IU]/mL — ABNORMAL LOW

## 2013-09-07 LAB — WBCS, STOOL

## 2013-09-07 LAB — LIPID PANEL
Cholesterol: 233 mg/dL — ABNORMAL HIGH (ref 0–200)
HDL Cholesterol: 9 mg/dL — ABNORMAL LOW (ref 40–60)
Ldl Cholesterol, Calc: 193 mg/dL — ABNORMAL HIGH (ref 0–100)
Triglycerides: 154 mg/dL (ref 0–200)
VLDL Cholesterol, Calc: 31 mg/dL (ref 5–40)

## 2013-09-07 LAB — RAPID INFLUENZA A&B ANTIGENS

## 2013-09-07 LAB — HEMOGLOBIN A1C: Hemoglobin A1C: 5.3 % (ref 4.2–6.3)

## 2013-09-07 LAB — PROTIME-INR
INR: 1.3
Prothrombin Time: 16.3 secs — ABNORMAL HIGH (ref 11.5–14.7)

## 2013-09-07 LAB — MAGNESIUM: Magnesium: 2.4 mg/dL

## 2013-09-07 LAB — PSA: PSA: 0.4 ng/mL (ref 0.0–4.0)

## 2013-09-08 LAB — COMPREHENSIVE METABOLIC PANEL
Albumin: 1.3 g/dL — ABNORMAL LOW (ref 3.4–5.0)
Alkaline Phosphatase: 598 U/L — ABNORMAL HIGH
BUN: 60 mg/dL — ABNORMAL HIGH (ref 7–18)
Calcium, Total: 8.3 mg/dL — ABNORMAL LOW (ref 8.5–10.1)
Co2: 12 mmol/L — ABNORMAL LOW (ref 21–32)
Glucose: 118 mg/dL — ABNORMAL HIGH (ref 65–99)
Osmolality: 297 (ref 275–301)
SGOT(AST): 450 U/L — ABNORMAL HIGH (ref 15–37)
SGPT (ALT): 262 U/L — ABNORMAL HIGH (ref 12–78)
Sodium: 140 mmol/L (ref 136–145)
Total Protein: 5.9 g/dL — ABNORMAL LOW (ref 6.4–8.2)

## 2013-09-08 LAB — CBC WITH DIFFERENTIAL/PLATELET
HGB: 11 g/dL — ABNORMAL LOW (ref 13.0–18.0)
Lymphocytes: 11 %
MCH: 36.2 pg — ABNORMAL HIGH (ref 26.0–34.0)
Segmented Neutrophils: 83 %
WBC: 14.1 10*3/uL — ABNORMAL HIGH (ref 3.8–10.6)

## 2013-09-08 LAB — AFP TUMOR MARKER: AFP-Tumor Marker: 3.3 ng/mL (ref 0.0–8.3)

## 2013-09-09 LAB — CK: CK, Total: 75 U/L (ref 35–232)

## 2013-09-09 LAB — COMPREHENSIVE METABOLIC PANEL
Anion Gap: 8 (ref 7–16)
Calcium, Total: 8.1 mg/dL — ABNORMAL LOW (ref 8.5–10.1)
Chloride: 116 mmol/L — ABNORMAL HIGH (ref 98–107)
Co2: 17 mmol/L — ABNORMAL LOW (ref 21–32)
Creatinine: 2.92 mg/dL — ABNORMAL HIGH (ref 0.60–1.30)
EGFR (African American): 25 — ABNORMAL LOW
EGFR (Non-African Amer.): 22 — ABNORMAL LOW
Glucose: 111 mg/dL — ABNORMAL HIGH (ref 65–99)
Sodium: 141 mmol/L (ref 136–145)

## 2013-09-09 LAB — HEPATIC FUNCTION PANEL A (ARMC)
Alkaline Phosphatase: 580 U/L — ABNORMAL HIGH
Bilirubin, Direct: 13.7 mg/dL — ABNORMAL HIGH (ref 0.00–0.20)
Bilirubin,Total: 18.5 mg/dL — ABNORMAL HIGH (ref 0.2–1.0)
SGOT(AST): 364 U/L — ABNORMAL HIGH (ref 15–37)
Total Protein: 6.2 g/dL — ABNORMAL LOW (ref 6.4–8.2)

## 2013-09-09 LAB — IRON AND TIBC
Iron Bind.Cap.(Total): 148 ug/dL — ABNORMAL LOW (ref 250–450)
Iron: 137 ug/dL (ref 65–175)
Unbound Iron-Bind.Cap.: 11 ug/dL

## 2013-09-09 LAB — PROTIME-INR: Prothrombin Time: 20.4 secs — ABNORMAL HIGH (ref 11.5–14.7)

## 2013-09-09 LAB — PHOSPHORUS: Phosphorus: 3.1 mg/dL (ref 2.5–4.9)

## 2013-09-09 LAB — STOOL CULTURE

## 2013-09-09 LAB — PROTEIN / CREATININE RATIO, URINE
Creatinine, Urine: 105.1 mg/dL (ref 30.0–125.0)
Protein, Random Urine: 48 mg/dL — ABNORMAL HIGH (ref 0–12)

## 2013-09-09 LAB — FERRITIN: Ferritin (ARMC): 1926 ng/mL — ABNORMAL HIGH (ref 8–388)

## 2013-09-09 LAB — URIC ACID: Uric Acid: 4.2 mg/dL (ref 3.5–7.2)

## 2013-09-10 ENCOUNTER — Ambulatory Visit: Payer: Self-pay | Admitting: Internal Medicine

## 2013-09-10 LAB — COMPREHENSIVE METABOLIC PANEL
Alkaline Phosphatase: 507 U/L — ABNORMAL HIGH
Anion Gap: 10 (ref 7–16)
BUN: 79 mg/dL — ABNORMAL HIGH (ref 7–18)
Calcium, Total: 8 mg/dL — ABNORMAL LOW (ref 8.5–10.1)
Chloride: 114 mmol/L — ABNORMAL HIGH (ref 98–107)
Co2: 15 mmol/L — ABNORMAL LOW (ref 21–32)
Glucose: 109 mg/dL — ABNORMAL HIGH (ref 65–99)
Osmolality: 302 (ref 275–301)
SGOT(AST): 321 U/L — ABNORMAL HIGH (ref 15–37)
SGPT (ALT): 224 U/L — ABNORMAL HIGH (ref 12–78)
Sodium: 139 mmol/L (ref 136–145)
Total Protein: 6.3 g/dL — ABNORMAL LOW (ref 6.4–8.2)

## 2013-09-11 LAB — TSH: Thyroid Stimulating Horm: 0.02 u[IU]/mL — ABNORMAL LOW

## 2013-09-11 LAB — COMPREHENSIVE METABOLIC PANEL
Alkaline Phosphatase: 458 U/L — ABNORMAL HIGH
Anion Gap: 12 (ref 7–16)
Chloride: 113 mmol/L — ABNORMAL HIGH (ref 98–107)
EGFR (Non-African Amer.): 11 — ABNORMAL LOW
Glucose: 134 mg/dL — ABNORMAL HIGH (ref 65–99)
Osmolality: 308 (ref 275–301)
Potassium: 3.8 mmol/L (ref 3.5–5.1)
SGPT (ALT): 202 U/L — ABNORMAL HIGH (ref 12–78)
Total Protein: 6.4 g/dL (ref 6.4–8.2)

## 2013-09-11 LAB — T4, FREE: Free Thyroxine: 1.32 ng/dL (ref 0.76–1.46)

## 2013-09-12 LAB — PLATELET COUNT: Platelet: 176 10*3/uL (ref 150–440)

## 2013-09-12 LAB — CULTURE, BLOOD (SINGLE)

## 2013-09-12 LAB — HEPATIC FUNCTION PANEL A (ARMC)
Bilirubin, Direct: 11.5 mg/dL — ABNORMAL HIGH (ref 0.00–0.20)
Bilirubin,Total: 14.7 mg/dL — ABNORMAL HIGH (ref 0.2–1.0)
SGOT(AST): 203 U/L — ABNORMAL HIGH (ref 15–37)
SGPT (ALT): 185 U/L — ABNORMAL HIGH (ref 12–78)
Total Protein: 6.5 g/dL (ref 6.4–8.2)

## 2013-09-12 LAB — BASIC METABOLIC PANEL
Anion Gap: 13 (ref 7–16)
Calcium, Total: 8 mg/dL — ABNORMAL LOW (ref 8.5–10.1)
Chloride: 113 mmol/L — ABNORMAL HIGH (ref 98–107)
Co2: 11 mmol/L — ABNORMAL LOW (ref 21–32)
EGFR (African American): 11 — ABNORMAL LOW
Glucose: 125 mg/dL — ABNORMAL HIGH (ref 65–99)
Osmolality: 309 (ref 275–301)
Potassium: 3.8 mmol/L (ref 3.5–5.1)
Sodium: 137 mmol/L (ref 136–145)

## 2013-09-13 LAB — HEPATIC FUNCTION PANEL A (ARMC)
Albumin: 1.3 g/dL — ABNORMAL LOW (ref 3.4–5.0)
Alkaline Phosphatase: 414 U/L — ABNORMAL HIGH
Bilirubin,Total: 13.4 mg/dL — ABNORMAL HIGH (ref 0.2–1.0)
SGPT (ALT): 160 U/L — ABNORMAL HIGH (ref 12–78)
Total Protein: 6.2 g/dL — ABNORMAL LOW (ref 6.4–8.2)

## 2013-09-13 LAB — BASIC METABOLIC PANEL
BUN: 122 mg/dL — ABNORMAL HIGH (ref 7–18)
Calcium, Total: 8.1 mg/dL — ABNORMAL LOW (ref 8.5–10.1)
Chloride: 116 mmol/L — ABNORMAL HIGH (ref 98–107)
Creatinine: 5.88 mg/dL — ABNORMAL HIGH (ref 0.60–1.30)
Glucose: 143 mg/dL — ABNORMAL HIGH (ref 65–99)
Potassium: 4.2 mmol/L (ref 3.5–5.1)
Sodium: 140 mmol/L (ref 136–145)

## 2013-09-13 LAB — PROTIME-INR: INR: 1.7

## 2013-09-13 LAB — HEMOGLOBIN: HGB: 10.7 g/dL — ABNORMAL LOW (ref 13.0–18.0)

## 2013-09-13 LAB — PHOSPHORUS: Phosphorus: 5.8 mg/dL — ABNORMAL HIGH (ref 2.5–4.9)

## 2013-09-13 LAB — UR PROT ELECTROPHORESIS, URINE RANDOM

## 2013-09-14 LAB — CBC WITH DIFFERENTIAL/PLATELET
Bands: 1 %
HCT: 30.3 % — ABNORMAL LOW (ref 40.0–52.0)
HGB: 10.6 g/dL — ABNORMAL LOW (ref 13.0–18.0)
MCH: 35.7 pg — ABNORMAL HIGH (ref 26.0–34.0)
MCV: 102 fL — ABNORMAL HIGH (ref 80–100)
NRBC/100 WBC: 11 /
Platelet: 135 10*3/uL — ABNORMAL LOW (ref 150–440)
RDW: 23.6 % — ABNORMAL HIGH (ref 11.5–14.5)
WBC: 24.5 10*3/uL — ABNORMAL HIGH (ref 3.8–10.6)

## 2013-09-14 LAB — HEPATIC FUNCTION PANEL A (ARMC)
Bilirubin,Total: 12.5 mg/dL — ABNORMAL HIGH (ref 0.2–1.0)
SGPT (ALT): 153 U/L — ABNORMAL HIGH (ref 12–78)

## 2013-09-14 LAB — KAPPA/LAMBDA FREE LIGHT CHAINS (ARMC)

## 2013-09-14 LAB — PROTIME-INR
INR: 1.9
Prothrombin Time: 21.2 secs — ABNORMAL HIGH (ref 11.5–14.7)

## 2013-09-14 LAB — PROTEIN ELECTROPHORESIS(ARMC)

## 2013-09-15 LAB — COMPREHENSIVE METABOLIC PANEL
Alkaline Phosphatase: 370 U/L — ABNORMAL HIGH
BUN: 76 mg/dL — ABNORMAL HIGH (ref 7–18)
Calcium, Total: 8 mg/dL — ABNORMAL LOW (ref 8.5–10.1)
Chloride: 108 mmol/L — ABNORMAL HIGH (ref 98–107)
Creatinine: 3.73 mg/dL — ABNORMAL HIGH (ref 0.60–1.30)
EGFR (African American): 19 — ABNORMAL LOW
EGFR (Non-African Amer.): 16 — ABNORMAL LOW
Glucose: 119 mg/dL — ABNORMAL HIGH (ref 65–99)
Osmolality: 305 (ref 275–301)
SGOT(AST): 190 U/L — ABNORMAL HIGH (ref 15–37)
Total Protein: 5.9 g/dL — ABNORMAL LOW (ref 6.4–8.2)

## 2013-09-15 LAB — CBC WITH DIFFERENTIAL/PLATELET
Bands: 2 %
HCT: 31 % — ABNORMAL LOW (ref 40.0–52.0)
MCHC: 34.3 g/dL (ref 32.0–36.0)
Metamyelocyte: 1 %
Monocytes: 4 %
Myelocyte: 1 %
NRBC/100 WBC: 9 /
RBC: 3.04 10*6/uL — ABNORMAL LOW (ref 4.40–5.90)
RDW: 23.3 % — ABNORMAL HIGH (ref 11.5–14.5)
Segmented Neutrophils: 78 %
WBC: 25.3 10*3/uL — ABNORMAL HIGH (ref 3.8–10.6)

## 2013-09-15 LAB — APTT: Activated PTT: 53.4 secs — ABNORMAL HIGH (ref 23.6–35.9)

## 2013-09-16 LAB — URINALYSIS, COMPLETE
Bacteria: NONE SEEN
Glucose,UR: NEGATIVE mg/dL (ref 0–75)
Ketone: NEGATIVE
Nitrite: NEGATIVE
Ph: 7 (ref 4.5–8.0)
Protein: 30
RBC,UR: 42 /HPF (ref 0–5)
Specific Gravity: 1.014 (ref 1.003–1.030)
Squamous Epithelial: 1
Transitional Epi: 1

## 2013-09-16 LAB — COMPREHENSIVE METABOLIC PANEL
Albumin: 1.1 g/dL — ABNORMAL LOW (ref 3.4–5.0)
Alkaline Phosphatase: 339 U/L — ABNORMAL HIGH
Anion Gap: 6 — ABNORMAL LOW (ref 7–16)
BUN: 47 mg/dL — ABNORMAL HIGH (ref 7–18)
Bilirubin,Total: 10.8 mg/dL — ABNORMAL HIGH (ref 0.2–1.0)
Co2: 30 mmol/L (ref 21–32)
Creatinine: 2.84 mg/dL — ABNORMAL HIGH (ref 0.60–1.30)
EGFR (African American): 26 — ABNORMAL LOW
Glucose: 133 mg/dL — ABNORMAL HIGH (ref 65–99)
Osmolality: 294 (ref 275–301)
Potassium: 2.8 mmol/L — ABNORMAL LOW (ref 3.5–5.1)
SGOT(AST): 198 U/L — ABNORMAL HIGH (ref 15–37)
SGPT (ALT): 131 U/L — ABNORMAL HIGH (ref 12–78)
Sodium: 140 mmol/L (ref 136–145)
Total Protein: 5.4 g/dL — ABNORMAL LOW (ref 6.4–8.2)

## 2013-09-16 LAB — CBC WITH DIFFERENTIAL/PLATELET
Bands: 1 %
HCT: 29.7 % — ABNORMAL LOW (ref 40.0–52.0)
HGB: 10.2 g/dL — ABNORMAL LOW (ref 13.0–18.0)
MCH: 35.2 pg — ABNORMAL HIGH (ref 26.0–34.0)
Monocytes: 5 %
NRBC/100 WBC: 11 /
Platelet: 89 10*3/uL — ABNORMAL LOW (ref 150–440)
RBC: 2.91 10*6/uL — ABNORMAL LOW (ref 4.40–5.90)
RDW: 23.6 % — ABNORMAL HIGH (ref 11.5–14.5)
WBC: 19.8 10*3/uL — ABNORMAL HIGH (ref 3.8–10.6)

## 2013-09-16 LAB — PROTIME-INR: INR: 1.7

## 2013-09-17 LAB — COMPREHENSIVE METABOLIC PANEL
Anion Gap: 11 (ref 7–16)
Bilirubin,Total: 10.5 mg/dL — ABNORMAL HIGH (ref 0.2–1.0)
Chloride: 104 mmol/L (ref 98–107)
EGFR (African American): 24 — ABNORMAL LOW
EGFR (Non-African Amer.): 21 — ABNORMAL LOW
Glucose: 173 mg/dL — ABNORMAL HIGH (ref 65–99)
Potassium: 4 mmol/L (ref 3.5–5.1)
SGOT(AST): 209 U/L — ABNORMAL HIGH (ref 15–37)
Total Protein: 5.9 g/dL — ABNORMAL LOW (ref 6.4–8.2)

## 2013-09-17 LAB — CBC WITH DIFFERENTIAL/PLATELET
Bands: 1 %
Lymphocytes: 2 %
MCHC: 33.8 g/dL (ref 32.0–36.0)
MCV: 104 fL — ABNORMAL HIGH (ref 80–100)
Monocytes: 2 %
NRBC/100 WBC: 7 /
Platelet: 78 10*3/uL — ABNORMAL LOW (ref 150–440)
RDW: 25.1 % — ABNORMAL HIGH (ref 11.5–14.5)
WBC: 25.6 10*3/uL — ABNORMAL HIGH (ref 3.8–10.6)

## 2013-09-17 LAB — CLOSTRIDIUM DIFFICILE(ARMC)

## 2013-09-19 LAB — CBC WITH DIFFERENTIAL/PLATELET
HCT: 31.2 % — ABNORMAL LOW (ref 40.0–52.0)
HGB: 10.7 g/dL — ABNORMAL LOW (ref 13.0–18.0)
MCHC: 34.3 g/dL (ref 32.0–36.0)
Platelet: 64 10*3/uL — ABNORMAL LOW (ref 150–440)
RBC: 2.97 10*6/uL — ABNORMAL LOW (ref 4.40–5.90)
Segmented Neutrophils: 87 %

## 2013-09-19 LAB — BASIC METABOLIC PANEL
BUN: 66 mg/dL — ABNORMAL HIGH (ref 7–18)
Calcium, Total: 8.1 mg/dL — ABNORMAL LOW (ref 8.5–10.1)
Co2: 26 mmol/L (ref 21–32)
Creatinine: 3.12 mg/dL — ABNORMAL HIGH (ref 0.60–1.30)
Glucose: 123 mg/dL — ABNORMAL HIGH (ref 65–99)
Osmolality: 300 (ref 275–301)
Sodium: 140 mmol/L (ref 136–145)

## 2013-09-20 LAB — COMPREHENSIVE METABOLIC PANEL
Albumin: 1.1 g/dL — ABNORMAL LOW (ref 3.4–5.0)
Alkaline Phosphatase: 371 U/L — ABNORMAL HIGH
Anion Gap: 8 (ref 7–16)
BUN: 72 mg/dL — ABNORMAL HIGH (ref 7–18)
Bilirubin,Total: 9.2 mg/dL — ABNORMAL HIGH (ref 0.2–1.0)
Chloride: 106 mmol/L (ref 98–107)
Creatinine: 3.04 mg/dL — ABNORMAL HIGH (ref 0.60–1.30)
EGFR (African American): 24 — ABNORMAL LOW
Glucose: 121 mg/dL — ABNORMAL HIGH (ref 65–99)
Osmolality: 302 (ref 275–301)
Potassium: 3.1 mmol/L — ABNORMAL LOW (ref 3.5–5.1)
SGOT(AST): 204 U/L — ABNORMAL HIGH (ref 15–37)
SGPT (ALT): 132 U/L — ABNORMAL HIGH (ref 12–78)
Sodium: 140 mmol/L (ref 136–145)

## 2013-09-21 LAB — CBC WITH DIFFERENTIAL/PLATELET
MCH: 36.8 pg — ABNORMAL HIGH (ref 26.0–34.0)
MCHC: 34.8 g/dL (ref 32.0–36.0)
Monocytes: 1 %
NRBC/100 WBC: 1 /
RBC: 2.94 10*6/uL — ABNORMAL LOW (ref 4.40–5.90)
Segmented Neutrophils: 97 %

## 2013-09-22 LAB — COMPREHENSIVE METABOLIC PANEL
Alkaline Phosphatase: 448 U/L — ABNORMAL HIGH
Anion Gap: 8 (ref 7–16)
Chloride: 105 mmol/L (ref 98–107)
Co2: 27 mmol/L (ref 21–32)
Creatinine: 3.04 mg/dL — ABNORMAL HIGH (ref 0.60–1.30)
EGFR (African American): 24 — ABNORMAL LOW
EGFR (Non-African Amer.): 21 — ABNORMAL LOW
Glucose: 125 mg/dL — ABNORMAL HIGH (ref 65–99)
Osmolality: 301 (ref 275–301)
SGOT(AST): 270 U/L — ABNORMAL HIGH (ref 15–37)
SGPT (ALT): 165 U/L — ABNORMAL HIGH (ref 12–78)
Sodium: 140 mmol/L (ref 136–145)
Total Protein: 5.5 g/dL — ABNORMAL LOW (ref 6.4–8.2)

## 2013-09-22 LAB — PROTIME-INR: INR: 1.9

## 2013-09-23 LAB — PHOSPHORUS: Phosphorus: 5.1 mg/dL — ABNORMAL HIGH (ref 2.5–4.9)

## 2013-09-24 LAB — COMPREHENSIVE METABOLIC PANEL
Alkaline Phosphatase: 406 U/L — ABNORMAL HIGH
BUN: 54 mg/dL — ABNORMAL HIGH (ref 7–18)
Bilirubin,Total: 8.9 mg/dL — ABNORMAL HIGH (ref 0.2–1.0)
Calcium, Total: 7.9 mg/dL — ABNORMAL LOW (ref 8.5–10.1)
Co2: 29 mmol/L (ref 21–32)
EGFR (African American): 29 — ABNORMAL LOW
EGFR (Non-African Amer.): 25 — ABNORMAL LOW
Glucose: 198 mg/dL — ABNORMAL HIGH (ref 65–99)
Osmolality: 298 (ref 275–301)
Potassium: 3.3 mmol/L — ABNORMAL LOW (ref 3.5–5.1)
Sodium: 139 mmol/L (ref 136–145)

## 2013-09-24 LAB — CBC WITH DIFFERENTIAL/PLATELET
Basophil #: 0.1 10*3/uL (ref 0.0–0.1)
Basophil %: 0.7 %
Eosinophil #: 0 10*3/uL (ref 0.0–0.7)
HCT: 30.1 % — ABNORMAL LOW (ref 40.0–52.0)
Lymphocyte #: 1.4 10*3/uL (ref 1.0–3.6)
Lymphocyte %: 7.5 %
MCV: 108 fL — ABNORMAL HIGH (ref 80–100)
Monocyte #: 0.8 x10 3/mm (ref 0.2–1.0)
Monocyte %: 4.2 %
Neutrophil %: 87.5 %
WBC: 17.4 10*3/uL — ABNORMAL HIGH (ref 3.8–10.6)

## 2013-09-27 LAB — RENAL FUNCTION PANEL
Albumin: 2 g/dL — ABNORMAL LOW (ref 3.4–5.0)
Anion Gap: 7 (ref 7–16)
BUN: 45 mg/dL — ABNORMAL HIGH (ref 7–18)
Calcium, Total: 8.2 mg/dL — ABNORMAL LOW (ref 8.5–10.1)
Chloride: 103 mmol/L (ref 98–107)
EGFR (African American): 25 — ABNORMAL LOW
Glucose: 210 mg/dL — ABNORMAL HIGH (ref 65–99)
Phosphorus: 3.4 mg/dL (ref 2.5–4.9)
Potassium: 3.8 mmol/L (ref 3.5–5.1)
Sodium: 139 mmol/L (ref 136–145)

## 2013-09-27 LAB — COMPREHENSIVE METABOLIC PANEL
Albumin: 2 g/dL — ABNORMAL LOW (ref 3.4–5.0)
Alkaline Phosphatase: 364 U/L — ABNORMAL HIGH
Anion Gap: 7 (ref 7–16)
BUN: 46 mg/dL — ABNORMAL HIGH (ref 7–18)
Chloride: 103 mmol/L (ref 98–107)
Creatinine: 2.93 mg/dL — ABNORMAL HIGH (ref 0.60–1.30)
EGFR (African American): 25 — ABNORMAL LOW
EGFR (Non-African Amer.): 22 — ABNORMAL LOW
Glucose: 215 mg/dL — ABNORMAL HIGH (ref 65–99)
SGOT(AST): 113 U/L — ABNORMAL HIGH (ref 15–37)
SGPT (ALT): 76 U/L (ref 12–78)
Sodium: 140 mmol/L (ref 136–145)
Total Protein: 5.8 g/dL — ABNORMAL LOW (ref 6.4–8.2)

## 2013-09-27 LAB — WBC: WBC: 15.1 10*3/uL — ABNORMAL HIGH (ref 3.8–10.6)

## 2013-09-28 LAB — CBC WITH DIFFERENTIAL/PLATELET
Basophil #: 0 10*3/uL (ref 0.0–0.1)
Basophil %: 0.1 %
Eosinophil #: 0 10*3/uL (ref 0.0–0.7)
Eosinophil %: 0 %
HCT: 30.3 % — ABNORMAL LOW (ref 40.0–52.0)
HGB: 10.1 g/dL — ABNORMAL LOW (ref 13.0–18.0)
Lymphocyte %: 5.8 %
MCH: 37.2 pg — ABNORMAL HIGH (ref 26.0–34.0)
MCHC: 33.4 g/dL (ref 32.0–36.0)
MCV: 111 fL — ABNORMAL HIGH (ref 80–100)
Monocyte %: 2.6 %
Neutrophil #: 11.6 10*3/uL — ABNORMAL HIGH (ref 1.4–6.5)
Neutrophil %: 91.5 %
RDW: 25.2 % — ABNORMAL HIGH (ref 11.5–14.5)
WBC: 12.6 10*3/uL — ABNORMAL HIGH (ref 3.8–10.6)

## 2013-09-29 LAB — CBC WITH DIFFERENTIAL/PLATELET
Basophil #: 0 10*3/uL (ref 0.0–0.1)
Eosinophil #: 0 10*3/uL (ref 0.0–0.7)
HCT: 31.6 % — ABNORMAL LOW (ref 40.0–52.0)
Lymphocyte #: 0.4 10*3/uL — ABNORMAL LOW (ref 1.0–3.6)
MCV: 111 fL — ABNORMAL HIGH (ref 80–100)
Monocyte #: 0.4 x10 3/mm (ref 0.2–1.0)
Monocyte %: 3.4 %
Neutrophil #: 11 10*3/uL — ABNORMAL HIGH (ref 1.4–6.5)
Neutrophil %: 92.6 %
Platelet: 26 10*3/uL — CL (ref 150–440)
RBC: 2.85 10*6/uL — ABNORMAL LOW (ref 4.40–5.90)
WBC: 11.9 10*3/uL — ABNORMAL HIGH (ref 3.8–10.6)

## 2013-09-29 LAB — COMPREHENSIVE METABOLIC PANEL
Anion Gap: 3 — ABNORMAL LOW (ref 7–16)
Bilirubin,Total: 5.4 mg/dL — ABNORMAL HIGH (ref 0.2–1.0)
Calcium, Total: 8.1 mg/dL — ABNORMAL LOW (ref 8.5–10.1)
Chloride: 104 mmol/L (ref 98–107)
Creatinine: 2.77 mg/dL — ABNORMAL HIGH (ref 0.60–1.30)
EGFR (Non-African Amer.): 23 — ABNORMAL LOW
Glucose: 197 mg/dL — ABNORMAL HIGH (ref 65–99)
Potassium: 3.7 mmol/L (ref 3.5–5.1)
SGOT(AST): 89 U/L — ABNORMAL HIGH (ref 15–37)
Sodium: 140 mmol/L (ref 136–145)

## 2013-09-30 LAB — CBC WITH DIFFERENTIAL/PLATELET
Basophil #: 0 10*3/uL (ref 0.0–0.1)
Basophil %: 0.2 %
Eosinophil #: 0 10*3/uL (ref 0.0–0.7)
Eosinophil %: 0.2 %
HCT: 32 % — ABNORMAL LOW (ref 40.0–52.0)
Lymphocyte #: 0.9 10*3/uL — ABNORMAL LOW (ref 1.0–3.6)
Lymphocyte %: 8.2 %
MCV: 111 fL — ABNORMAL HIGH (ref 80–100)
Monocyte #: 0.4 x10 3/mm (ref 0.2–1.0)
Neutrophil #: 9.9 10*3/uL — ABNORMAL HIGH (ref 1.4–6.5)
Platelet: 24 10*3/uL — CL (ref 150–440)
WBC: 11.3 10*3/uL — ABNORMAL HIGH (ref 3.8–10.6)

## 2013-09-30 LAB — BASIC METABOLIC PANEL
Anion Gap: 7 (ref 7–16)
Calcium, Total: 8 mg/dL — ABNORMAL LOW (ref 8.5–10.1)
Chloride: 106 mmol/L (ref 98–107)
EGFR (Non-African Amer.): 21 — ABNORMAL LOW
Osmolality: 297 (ref 275–301)
Potassium: 3.7 mmol/L (ref 3.5–5.1)

## 2013-10-01 ENCOUNTER — Inpatient Hospital Stay
Admission: AD | Admit: 2013-10-01 | Discharge: 2013-11-07 | Disposition: E | Payer: Medicare Other | Source: Intra-hospital | Attending: Internal Medicine | Admitting: Internal Medicine

## 2013-10-01 DIAGNOSIS — J9600 Acute respiratory failure, unspecified whether with hypoxia or hypercapnia: Secondary | ICD-10-CM

## 2013-10-01 DIAGNOSIS — N179 Acute kidney failure, unspecified: Secondary | ICD-10-CM

## 2013-10-01 DIAGNOSIS — K7200 Acute and subacute hepatic failure without coma: Secondary | ICD-10-CM

## 2013-10-01 LAB — BODY FLUID CELL COUNT WITH DIFFERENTIAL
Basophil: 0 %
Eosinophil: 0 %
Lymphocytes: 16 %
Neutrophils: 16 %
Nucleated Cell Count: 21 /mm3
Other Cells BF: 0 %
Other Mononuclear Cells: 68 %

## 2013-10-01 LAB — PROTEIN, BODY FLUID: Protein, Body Fluid: 0.5 g/dL

## 2013-10-01 LAB — ALBUMIN, FLUID (OTHER): Body Fluid Albumin: <0.6 g/dL

## 2013-10-01 LAB — GLUCOSE, SEROUS FLUID: Glucose, Body Fluid: 176 mg/dL

## 2013-10-01 LAB — AMYLASE, BODY FLUID: Amylase, Body Fluid: 80 U/L

## 2013-10-02 ENCOUNTER — Other Ambulatory Visit (HOSPITAL_COMMUNITY): Payer: Self-pay

## 2013-10-02 LAB — HEPATIC FUNCTION PANEL
AST: 77 U/L — ABNORMAL HIGH (ref 0–37)
Albumin: 1.9 g/dL — ABNORMAL LOW (ref 3.5–5.2)
Alkaline Phosphatase: 294 U/L — ABNORMAL HIGH (ref 39–117)
Bilirubin, Direct: 2.7 mg/dL — ABNORMAL HIGH (ref 0.0–0.3)
Indirect Bilirubin: 2.9 mg/dL — ABNORMAL HIGH (ref 0.3–0.9)
Total Bilirubin: 5.6 mg/dL — ABNORMAL HIGH (ref 0.3–1.2)

## 2013-10-02 LAB — BASIC METABOLIC PANEL
CO2: 26 mEq/L (ref 19–32)
Chloride: 102 mEq/L (ref 96–112)
Creatinine, Ser: 3.14 mg/dL — ABNORMAL HIGH (ref 0.50–1.35)
GFR calc non Af Amer: 20 mL/min — ABNORMAL LOW (ref >90)
Glucose, Bld: 114 mg/dL — ABNORMAL HIGH (ref 70–99)
Potassium: 4.5 mEq/L (ref 3.5–5.1)
Sodium: 140 mEq/L (ref 135–145)

## 2013-10-02 LAB — PROTIME-INR: Prothrombin Time: 16.1 seconds — ABNORMAL HIGH (ref 11.6–15.2)

## 2013-10-02 LAB — AMMONIA: Ammonia: 35 umol/L (ref 11–60)

## 2013-10-02 LAB — C-REACTIVE PROTEIN: CRP: 2 mg/dL — ABNORMAL HIGH (ref <0.60)

## 2013-10-04 LAB — BASIC METABOLIC PANEL
CO2: 26 mEq/L (ref 19–32)
Calcium: 7.7 mg/dL — ABNORMAL LOW (ref 8.4–10.5)
Chloride: 104 mEq/L (ref 96–112)
GFR calc non Af Amer: 19 mL/min — ABNORMAL LOW (ref >90)
Glucose, Bld: 138 mg/dL — ABNORMAL HIGH (ref 70–99)
Sodium: 142 mEq/L (ref 135–145)

## 2013-10-04 LAB — HEPATIC FUNCTION PANEL
ALT: 60 U/L — ABNORMAL HIGH (ref 0–53)
Albumin: 1.7 g/dL — ABNORMAL LOW (ref 3.5–5.2)
Bilirubin, Direct: 2.5 mg/dL — ABNORMAL HIGH (ref 0.0–0.3)
Indirect Bilirubin: 1.9 mg/dL — ABNORMAL HIGH (ref 0.3–0.9)
Total Bilirubin: 4.4 mg/dL — ABNORMAL HIGH (ref 0.3–1.2)

## 2013-10-05 LAB — BODY FLUID CULTURE

## 2013-10-06 LAB — CBC
Platelets: 35 10*3/uL — ABNORMAL LOW (ref 150–400)
RBC: 3.15 MIL/uL — ABNORMAL LOW (ref 4.22–5.81)
WBC: 9.6 10*3/uL (ref 4.0–10.5)

## 2013-10-06 LAB — RENAL FUNCTION PANEL
BUN: 69 mg/dL — ABNORMAL HIGH (ref 6–23)
CO2: 25 mEq/L (ref 19–32)
Chloride: 104 mEq/L (ref 96–112)
GFR calc Af Amer: 23 mL/min — ABNORMAL LOW (ref >90)
GFR calc non Af Amer: 19 mL/min — ABNORMAL LOW (ref >90)
Potassium: 4 mEq/L (ref 3.7–5.3)
Sodium: 143 mEq/L (ref 137–147)

## 2013-10-07 ENCOUNTER — Ambulatory Visit: Payer: Self-pay | Admitting: Internal Medicine

## 2013-10-07 DEATH — deceased

## 2013-10-08 LAB — RENAL FUNCTION PANEL
ALBUMIN: 2.1 g/dL — AB (ref 3.5–5.2)
BUN: 57 mg/dL — AB (ref 6–23)
CALCIUM: 7.9 mg/dL — AB (ref 8.4–10.5)
CHLORIDE: 100 meq/L (ref 96–112)
CO2: 27 mEq/L (ref 19–32)
CREATININE: 2.89 mg/dL — AB (ref 0.50–1.35)
GFR calc Af Amer: 25 mL/min — ABNORMAL LOW (ref >90)
GFR calc non Af Amer: 22 mL/min — ABNORMAL LOW (ref >90)
Glucose, Bld: 125 mg/dL — ABNORMAL HIGH (ref 70–99)
Phosphorus: 3.6 mg/dL (ref 2.3–4.6)
Potassium: 3 mEq/L — ABNORMAL LOW (ref 3.7–5.3)
Sodium: 144 mEq/L (ref 137–147)

## 2013-10-08 LAB — CBC
HEMATOCRIT: 35.3 % — AB (ref 39.0–52.0)
Hemoglobin: 12 g/dL — ABNORMAL LOW (ref 13.0–17.0)
MCH: 36.1 pg — AB (ref 26.0–34.0)
MCHC: 34 g/dL (ref 30.0–36.0)
MCV: 106.3 fL — AB (ref 78.0–100.0)
Platelets: 36 10*3/uL — ABNORMAL LOW (ref 150–400)
RBC: 3.32 MIL/uL — ABNORMAL LOW (ref 4.22–5.81)
RDW: 18.2 % — AB (ref 11.5–15.5)
WBC: 6.7 10*3/uL (ref 4.0–10.5)

## 2013-10-08 LAB — HEPATITIS B SURFACE ANTIGEN: Hepatitis B Surface Ag: NEGATIVE

## 2013-10-09 LAB — BASIC METABOLIC PANEL
BUN: 33 mg/dL — AB (ref 6–23)
CALCIUM: 7.5 mg/dL — AB (ref 8.4–10.5)
CO2: 29 meq/L (ref 19–32)
Chloride: 101 mEq/L (ref 96–112)
Creatinine, Ser: 2.1 mg/dL — ABNORMAL HIGH (ref 0.50–1.35)
GFR calc Af Amer: 37 mL/min — ABNORMAL LOW (ref >90)
GFR calc non Af Amer: 32 mL/min — ABNORMAL LOW (ref >90)
GLUCOSE: 141 mg/dL — AB (ref 70–99)
Potassium: 3.4 mEq/L — ABNORMAL LOW (ref 3.7–5.3)
SODIUM: 142 meq/L (ref 137–147)

## 2013-10-10 LAB — BASIC METABOLIC PANEL
BUN: 47 mg/dL — ABNORMAL HIGH (ref 6–23)
CO2: 29 mEq/L (ref 19–32)
Calcium: 7.6 mg/dL — ABNORMAL LOW (ref 8.4–10.5)
Chloride: 101 mEq/L (ref 96–112)
Creatinine, Ser: 2.53 mg/dL — ABNORMAL HIGH (ref 0.50–1.35)
GFR calc Af Amer: 30 mL/min — ABNORMAL LOW (ref >90)
GFR, EST NON AFRICAN AMERICAN: 26 mL/min — AB (ref >90)
GLUCOSE: 102 mg/dL — AB (ref 70–99)
POTASSIUM: 3.4 meq/L — AB (ref 3.7–5.3)
Sodium: 142 mEq/L (ref 137–147)

## 2013-10-10 LAB — MAGNESIUM: Magnesium: 1.6 mg/dL (ref 1.5–2.5)

## 2013-10-11 LAB — BASIC METABOLIC PANEL
BUN: 55 mg/dL — ABNORMAL HIGH (ref 6–23)
CO2: 28 mEq/L (ref 19–32)
Calcium: 7.6 mg/dL — ABNORMAL LOW (ref 8.4–10.5)
Chloride: 100 mEq/L (ref 96–112)
Creatinine, Ser: 2.71 mg/dL — ABNORMAL HIGH (ref 0.50–1.35)
GFR calc Af Amer: 27 mL/min — ABNORMAL LOW (ref >90)
GFR calc non Af Amer: 24 mL/min — ABNORMAL LOW (ref >90)
Glucose, Bld: 144 mg/dL — ABNORMAL HIGH (ref 70–99)
POTASSIUM: 4 meq/L (ref 3.7–5.3)
Sodium: 142 mEq/L (ref 137–147)

## 2013-10-13 LAB — COMPREHENSIVE METABOLIC PANEL
ALK PHOS: 253 U/L — AB (ref 39–117)
ALT: 72 U/L — AB (ref 0–53)
AST: 56 U/L — ABNORMAL HIGH (ref 0–37)
Albumin: 1.7 g/dL — ABNORMAL LOW (ref 3.5–5.2)
BUN: 72 mg/dL — ABNORMAL HIGH (ref 6–23)
CHLORIDE: 99 meq/L (ref 96–112)
CO2: 26 meq/L (ref 19–32)
Calcium: 7.9 mg/dL — ABNORMAL LOW (ref 8.4–10.5)
Creatinine, Ser: 2.87 mg/dL — ABNORMAL HIGH (ref 0.50–1.35)
GFR calc non Af Amer: 22 mL/min — ABNORMAL LOW (ref >90)
GFR, EST AFRICAN AMERICAN: 25 mL/min — AB (ref >90)
GLUCOSE: 167 mg/dL — AB (ref 70–99)
POTASSIUM: 3.7 meq/L (ref 3.7–5.3)
SODIUM: 142 meq/L (ref 137–147)
Total Bilirubin: 2.4 mg/dL — ABNORMAL HIGH (ref 0.3–1.2)
Total Protein: 6.6 g/dL (ref 6.0–8.3)

## 2013-10-13 LAB — PHOSPHORUS: Phosphorus: 3 mg/dL (ref 2.3–4.6)

## 2013-10-14 LAB — CBC WITH DIFFERENTIAL/PLATELET
Basophils Absolute: 0 10*3/uL (ref 0.0–0.1)
Basophils Relative: 0 % (ref 0–1)
EOS ABS: 0 10*3/uL (ref 0.0–0.7)
Eosinophils Relative: 0 % (ref 0–5)
HCT: 30.6 % — ABNORMAL LOW (ref 39.0–52.0)
HEMOGLOBIN: 10.4 g/dL — AB (ref 13.0–17.0)
Lymphocytes Relative: 17 % (ref 12–46)
Lymphs Abs: 1.8 10*3/uL (ref 0.7–4.0)
MCH: 36.4 pg — AB (ref 26.0–34.0)
MCHC: 34 g/dL (ref 30.0–36.0)
MCV: 107 fL — ABNORMAL HIGH (ref 78.0–100.0)
MONOS PCT: 2 % — AB (ref 3–12)
Monocytes Absolute: 0.2 10*3/uL (ref 0.1–1.0)
Neutro Abs: 8.7 10*3/uL — ABNORMAL HIGH (ref 1.7–7.7)
Neutrophils Relative %: 81 % — ABNORMAL HIGH (ref 43–77)
Platelets: 70 10*3/uL — ABNORMAL LOW (ref 150–400)
RBC: 2.86 MIL/uL — ABNORMAL LOW (ref 4.22–5.81)
RDW: 16.6 % — ABNORMAL HIGH (ref 11.5–15.5)
WBC: 10.7 10*3/uL — ABNORMAL HIGH (ref 4.0–10.5)

## 2013-10-14 LAB — BASIC METABOLIC PANEL
BUN: 82 mg/dL — AB (ref 6–23)
CALCIUM: 8 mg/dL — AB (ref 8.4–10.5)
CO2: 27 mEq/L (ref 19–32)
CREATININE: 2.75 mg/dL — AB (ref 0.50–1.35)
Chloride: 98 mEq/L (ref 96–112)
GFR calc Af Amer: 27 mL/min — ABNORMAL LOW (ref >90)
GFR calc non Af Amer: 23 mL/min — ABNORMAL LOW (ref >90)
Glucose, Bld: 201 mg/dL — ABNORMAL HIGH (ref 70–99)
Potassium: 4.5 mEq/L (ref 3.7–5.3)
Sodium: 143 mEq/L (ref 137–147)

## 2013-10-16 LAB — RENAL FUNCTION PANEL
Albumin: 2 g/dL — ABNORMAL LOW (ref 3.5–5.2)
BUN: 93 mg/dL — ABNORMAL HIGH (ref 6–23)
CO2: 28 mEq/L (ref 19–32)
Calcium: 8.2 mg/dL — ABNORMAL LOW (ref 8.4–10.5)
Chloride: 101 mEq/L (ref 96–112)
Creatinine, Ser: 2.77 mg/dL — ABNORMAL HIGH (ref 0.50–1.35)
GFR calc non Af Amer: 23 mL/min — ABNORMAL LOW (ref >90)
GFR, EST AFRICAN AMERICAN: 27 mL/min — AB (ref >90)
Glucose, Bld: 148 mg/dL — ABNORMAL HIGH (ref 70–99)
PHOSPHORUS: 3.6 mg/dL (ref 2.3–4.6)
Potassium: 3.4 mEq/L — ABNORMAL LOW (ref 3.7–5.3)
Sodium: 147 mEq/L (ref 137–147)

## 2013-10-17 LAB — BASIC METABOLIC PANEL
BUN: 96 mg/dL — ABNORMAL HIGH (ref 6–23)
CALCIUM: 8 mg/dL — AB (ref 8.4–10.5)
CO2: 29 meq/L (ref 19–32)
Chloride: 102 mEq/L (ref 96–112)
Creatinine, Ser: 2.79 mg/dL — ABNORMAL HIGH (ref 0.50–1.35)
GFR calc Af Amer: 26 mL/min — ABNORMAL LOW (ref >90)
GFR calc non Af Amer: 23 mL/min — ABNORMAL LOW (ref >90)
Glucose, Bld: 178 mg/dL — ABNORMAL HIGH (ref 70–99)
POTASSIUM: 3 meq/L — AB (ref 3.7–5.3)
SODIUM: 147 meq/L (ref 137–147)

## 2013-10-18 ENCOUNTER — Other Ambulatory Visit (HOSPITAL_COMMUNITY): Payer: Self-pay

## 2013-10-18 ENCOUNTER — Encounter: Payer: Self-pay | Admitting: Radiology

## 2013-10-18 LAB — CBC WITH DIFFERENTIAL/PLATELET
BASOS ABS: 0 10*3/uL (ref 0.0–0.1)
Basophils Absolute: 0 10*3/uL (ref 0.0–0.1)
Basophils Relative: 0 % (ref 0–1)
Basophils Relative: 0 % (ref 0–1)
EOS ABS: 0 10*3/uL (ref 0.0–0.7)
EOS PCT: 0 % (ref 0–5)
Eosinophils Absolute: 0 10*3/uL (ref 0.0–0.7)
Eosinophils Relative: 0 % (ref 0–5)
HCT: 31 % — ABNORMAL LOW (ref 39.0–52.0)
HEMATOCRIT: 26.9 % — AB (ref 39.0–52.0)
Hemoglobin: 9.2 g/dL — ABNORMAL LOW (ref 13.0–17.0)
Hemoglobin: 10.4 g/dL — ABNORMAL LOW (ref 13.0–17.0)
LYMPHS ABS: 2.1 10*3/uL (ref 0.7–4.0)
LYMPHS PCT: 11 % — AB (ref 12–46)
Lymphocytes Relative: 16 % (ref 12–46)
Lymphs Abs: 1.7 10*3/uL (ref 0.7–4.0)
MCH: 36.1 pg — AB (ref 26.0–34.0)
MCH: 35.7 pg — ABNORMAL HIGH (ref 26.0–34.0)
MCHC: 34.2 g/dL (ref 30.0–36.0)
MCHC: 33.5 g/dL (ref 30.0–36.0)
MCV: 105.5 fL — AB (ref 78.0–100.0)
MCV: 106.5 fL — AB (ref 78.0–100.0)
MONO ABS: 0.5 10*3/uL (ref 0.1–1.0)
Monocytes Absolute: 0.6 10*3/uL (ref 0.1–1.0)
Monocytes Relative: 4 % (ref 3–12)
Monocytes Relative: 4 % (ref 3–12)
Neutro Abs: 10.6 10*3/uL — ABNORMAL HIGH (ref 1.7–7.7)
Neutro Abs: 13.4 10*3/uL — ABNORMAL HIGH (ref 1.7–7.7)
Neutrophils Relative %: 80 % — ABNORMAL HIGH (ref 43–77)
Neutrophils Relative %: 85 % — ABNORMAL HIGH (ref 43–77)
Platelets: 84 10*3/uL — ABNORMAL LOW (ref 150–400)
Platelets: 110 10*3/uL — ABNORMAL LOW (ref 150–400)
RBC: 2.55 MIL/uL — ABNORMAL LOW (ref 4.22–5.81)
RBC: 2.91 MIL/uL — ABNORMAL LOW (ref 4.22–5.81)
RDW: 16.4 % — AB (ref 11.5–15.5)
RDW: 15.8 % — ABNORMAL HIGH (ref 11.5–15.5)
WBC: 13.3 10*3/uL — AB (ref 4.0–10.5)
WBC: 15.7 10*3/uL — AB (ref 4.0–10.5)

## 2013-10-18 LAB — URINALYSIS, ROUTINE W REFLEX MICROSCOPIC
BILIRUBIN URINE: NEGATIVE
GLUCOSE, UA: NEGATIVE mg/dL
HGB URINE DIPSTICK: NEGATIVE
Ketones, ur: NEGATIVE mg/dL
Leukocytes, UA: NEGATIVE
Nitrite: NEGATIVE
PROTEIN: NEGATIVE mg/dL
Specific Gravity, Urine: 1.011 (ref 1.005–1.030)
Urobilinogen, UA: 0.2 mg/dL (ref 0.0–1.0)
pH: 7.5 (ref 5.0–8.0)

## 2013-10-18 LAB — COMPREHENSIVE METABOLIC PANEL
ALT: 58 U/L — ABNORMAL HIGH (ref 0–53)
ALT: 59 U/L — AB (ref 0–53)
AST: 37 U/L (ref 0–37)
AST: 40 U/L — AB (ref 0–37)
Albumin: 1.6 g/dL — ABNORMAL LOW (ref 3.5–5.2)
Albumin: 1.7 g/dL — ABNORMAL LOW (ref 3.5–5.2)
Alkaline Phosphatase: 226 U/L — ABNORMAL HIGH (ref 39–117)
Alkaline Phosphatase: 239 U/L — ABNORMAL HIGH (ref 39–117)
BUN: 104 mg/dL — ABNORMAL HIGH (ref 6–23)
BUN: 108 mg/dL — ABNORMAL HIGH (ref 6–23)
CALCIUM: 7.8 mg/dL — AB (ref 8.4–10.5)
CO2: 27 mEq/L (ref 19–32)
CO2: 26 mEq/L (ref 19–32)
CREATININE: 2.8 mg/dL — AB (ref 0.50–1.35)
Calcium: 7.9 mg/dL — ABNORMAL LOW (ref 8.4–10.5)
Chloride: 102 mEq/L (ref 96–112)
Chloride: 103 mEq/L (ref 96–112)
Creatinine, Ser: 2.76 mg/dL — ABNORMAL HIGH (ref 0.50–1.35)
GFR calc Af Amer: 27 mL/min — ABNORMAL LOW (ref >90)
GFR calc non Af Amer: 23 mL/min — ABNORMAL LOW (ref >90)
GFR, EST AFRICAN AMERICAN: 26 mL/min — AB (ref >90)
GFR, EST NON AFRICAN AMERICAN: 23 mL/min — AB (ref >90)
GLUCOSE: 153 mg/dL — AB (ref 70–99)
Glucose, Bld: 205 mg/dL — ABNORMAL HIGH (ref 70–99)
POTASSIUM: 3.7 meq/L (ref 3.7–5.3)
Potassium: 3.5 mEq/L — ABNORMAL LOW (ref 3.7–5.3)
SODIUM: 144 meq/L (ref 137–147)
Sodium: 144 mEq/L (ref 137–147)
TOTAL PROTEIN: 6.9 g/dL (ref 6.0–8.3)
Total Bilirubin: 2 mg/dL — ABNORMAL HIGH (ref 0.3–1.2)
Total Bilirubin: 2.1 mg/dL — ABNORMAL HIGH (ref 0.3–1.2)
Total Protein: 6.5 g/dL (ref 6.0–8.3)

## 2013-10-18 LAB — BASIC METABOLIC PANEL
BUN: 107 mg/dL — ABNORMAL HIGH (ref 6–23)
CALCIUM: 8.1 mg/dL — AB (ref 8.4–10.5)
CO2: 26 meq/L (ref 19–32)
Chloride: 103 mEq/L (ref 96–112)
Creatinine, Ser: 2.87 mg/dL — ABNORMAL HIGH (ref 0.50–1.35)
GFR calc Af Amer: 25 mL/min — ABNORMAL LOW (ref >90)
GFR calc non Af Amer: 22 mL/min — ABNORMAL LOW (ref >90)
GLUCOSE: 145 mg/dL — AB (ref 70–99)
Potassium: 3.3 mEq/L — ABNORMAL LOW (ref 3.7–5.3)
Sodium: 146 mEq/L (ref 137–147)

## 2013-10-18 LAB — PREALBUMIN: PREALBUMIN: 7.4 mg/dL — AB (ref 17.0–34.0)

## 2013-10-19 LAB — CBC WITH DIFFERENTIAL/PLATELET
BASOS ABS: 0 10*3/uL (ref 0.0–0.1)
Basophils Relative: 0 % (ref 0–1)
EOS ABS: 0 10*3/uL (ref 0.0–0.7)
EOS PCT: 0 % (ref 0–5)
HCT: 29.1 % — ABNORMAL LOW (ref 39.0–52.0)
Hemoglobin: 9.9 g/dL — ABNORMAL LOW (ref 13.0–17.0)
Lymphocytes Relative: 8 % — ABNORMAL LOW (ref 12–46)
Lymphs Abs: 1.1 10*3/uL (ref 0.7–4.0)
MCH: 36.1 pg — AB (ref 26.0–34.0)
MCHC: 34 g/dL (ref 30.0–36.0)
MCV: 106.2 fL — AB (ref 78.0–100.0)
Monocytes Absolute: 0.4 10*3/uL (ref 0.1–1.0)
Monocytes Relative: 3 % (ref 3–12)
Neutro Abs: 11.9 10*3/uL — ABNORMAL HIGH (ref 1.7–7.7)
Neutrophils Relative %: 89 % — ABNORMAL HIGH (ref 43–77)
PLATELETS: 97 10*3/uL — AB (ref 150–400)
RBC: 2.74 MIL/uL — ABNORMAL LOW (ref 4.22–5.81)
RDW: 15.9 % — AB (ref 11.5–15.5)
WBC: 13.5 10*3/uL — AB (ref 4.0–10.5)

## 2013-10-19 LAB — COMPREHENSIVE METABOLIC PANEL
ALT: 51 U/L (ref 0–53)
AST: 38 U/L — ABNORMAL HIGH (ref 0–37)
Albumin: 1.5 g/dL — ABNORMAL LOW (ref 3.5–5.2)
Alkaline Phosphatase: 247 U/L — ABNORMAL HIGH (ref 39–117)
BUN: 109 mg/dL — ABNORMAL HIGH (ref 6–23)
CHLORIDE: 106 meq/L (ref 96–112)
CO2: 27 mEq/L (ref 19–32)
Calcium: 7.7 mg/dL — ABNORMAL LOW (ref 8.4–10.5)
Creatinine, Ser: 2.86 mg/dL — ABNORMAL HIGH (ref 0.50–1.35)
GFR calc Af Amer: 26 mL/min — ABNORMAL LOW (ref >90)
GFR calc non Af Amer: 22 mL/min — ABNORMAL LOW (ref >90)
GLUCOSE: 226 mg/dL — AB (ref 70–99)
Potassium: 3 mEq/L — ABNORMAL LOW (ref 3.7–5.3)
SODIUM: 148 meq/L — AB (ref 137–147)
TOTAL PROTEIN: 6.4 g/dL (ref 6.0–8.3)
Total Bilirubin: 1.9 mg/dL — ABNORMAL HIGH (ref 0.3–1.2)

## 2013-10-19 LAB — CLOSTRIDIUM DIFFICILE BY PCR: Toxigenic C. Difficile by PCR: NEGATIVE

## 2013-10-19 LAB — URINE CULTURE: Colony Count: 10000

## 2013-10-19 LAB — HEMOGLOBIN AND HEMATOCRIT, BLOOD
HCT: 29.6 % — ABNORMAL LOW (ref 39.0–52.0)
HCT: 27.9 % — ABNORMAL LOW (ref 39.0–52.0)
HEMATOCRIT: 29.1 % — AB (ref 39.0–52.0)
HEMOGLOBIN: 10.1 g/dL — AB (ref 13.0–17.0)
Hemoglobin: 10.1 g/dL — ABNORMAL LOW (ref 13.0–17.0)
Hemoglobin: 9.6 g/dL — ABNORMAL LOW (ref 13.0–17.0)

## 2013-10-20 ENCOUNTER — Other Ambulatory Visit (HOSPITAL_COMMUNITY): Payer: Self-pay

## 2013-10-20 LAB — HEMOGLOBIN AND HEMATOCRIT, BLOOD
HCT: 30.4 % — ABNORMAL LOW (ref 39.0–52.0)
HEMATOCRIT: 30.3 % — AB (ref 39.0–52.0)
HEMOGLOBIN: 10.4 g/dL — AB (ref 13.0–17.0)
Hemoglobin: 10.3 g/dL — ABNORMAL LOW (ref 13.0–17.0)

## 2013-10-20 LAB — CBC WITH DIFFERENTIAL/PLATELET
BASOS ABS: 0 10*3/uL (ref 0.0–0.1)
BASOS PCT: 0 % (ref 0–1)
Eosinophils Absolute: 0 10*3/uL (ref 0.0–0.7)
Eosinophils Relative: 0 % (ref 0–5)
HEMATOCRIT: 28.5 % — AB (ref 39.0–52.0)
Hemoglobin: 9.8 g/dL — ABNORMAL LOW (ref 13.0–17.0)
Lymphocytes Relative: 11 % — ABNORMAL LOW (ref 12–46)
Lymphs Abs: 1.5 10*3/uL (ref 0.7–4.0)
MCH: 35.4 pg — ABNORMAL HIGH (ref 26.0–34.0)
MCHC: 34.4 g/dL (ref 30.0–36.0)
MCV: 102.9 fL — ABNORMAL HIGH (ref 78.0–100.0)
MONO ABS: 0.5 10*3/uL (ref 0.1–1.0)
Monocytes Relative: 4 % (ref 3–12)
NEUTROS ABS: 12.1 10*3/uL — AB (ref 1.7–7.7)
Neutrophils Relative %: 86 % — ABNORMAL HIGH (ref 43–77)
PLATELETS: 102 10*3/uL — AB (ref 150–400)
RBC: 2.77 MIL/uL — ABNORMAL LOW (ref 4.22–5.81)
RDW: 15.9 % — AB (ref 11.5–15.5)
WBC: 14.1 10*3/uL — ABNORMAL HIGH (ref 4.0–10.5)

## 2013-10-20 LAB — BASIC METABOLIC PANEL
BUN: 108 mg/dL — ABNORMAL HIGH (ref 6–23)
CALCIUM: 8 mg/dL — AB (ref 8.4–10.5)
CHLORIDE: 105 meq/L (ref 96–112)
CO2: 24 mEq/L (ref 19–32)
Creatinine, Ser: 3.08 mg/dL — ABNORMAL HIGH (ref 0.50–1.35)
GFR calc non Af Amer: 20 mL/min — ABNORMAL LOW (ref >90)
GFR, EST AFRICAN AMERICAN: 23 mL/min — AB (ref >90)
Glucose, Bld: 185 mg/dL — ABNORMAL HIGH (ref 70–99)
Potassium: 2.7 mEq/L — CL (ref 3.7–5.3)
SODIUM: 147 meq/L (ref 137–147)

## 2013-10-20 LAB — MAGNESIUM: Magnesium: 1.9 mg/dL (ref 1.5–2.5)

## 2013-10-20 LAB — POTASSIUM: Potassium: 3.5 mEq/L — ABNORMAL LOW (ref 3.7–5.3)

## 2013-10-21 ENCOUNTER — Other Ambulatory Visit (HOSPITAL_COMMUNITY): Payer: Self-pay

## 2013-10-21 LAB — CBC
HEMATOCRIT: 30 % — AB (ref 39.0–52.0)
HEMOGLOBIN: 10 g/dL — AB (ref 13.0–17.0)
MCH: 35 pg — ABNORMAL HIGH (ref 26.0–34.0)
MCHC: 33.3 g/dL (ref 30.0–36.0)
MCV: 104.9 fL — ABNORMAL HIGH (ref 78.0–100.0)
Platelets: 95 10*3/uL — ABNORMAL LOW (ref 150–400)
RBC: 2.86 MIL/uL — ABNORMAL LOW (ref 4.22–5.81)
RDW: 15.7 % — ABNORMAL HIGH (ref 11.5–15.5)
WBC: 15.1 10*3/uL — AB (ref 4.0–10.5)

## 2013-10-21 LAB — BASIC METABOLIC PANEL
BUN: 98 mg/dL — AB (ref 6–23)
CHLORIDE: 108 meq/L (ref 96–112)
CO2: 22 meq/L (ref 19–32)
Calcium: 7.8 mg/dL — ABNORMAL LOW (ref 8.4–10.5)
Creatinine, Ser: 2.92 mg/dL — ABNORMAL HIGH (ref 0.50–1.35)
GFR calc non Af Amer: 22 mL/min — ABNORMAL LOW (ref >90)
GFR, EST AFRICAN AMERICAN: 25 mL/min — AB (ref >90)
GLUCOSE: 200 mg/dL — AB (ref 70–99)
POTASSIUM: 3 meq/L — AB (ref 3.7–5.3)
Sodium: 148 mEq/L — ABNORMAL HIGH (ref 137–147)

## 2013-10-21 LAB — HEMOGLOBIN AND HEMATOCRIT, BLOOD
HCT: 29.1 % — ABNORMAL LOW (ref 39.0–52.0)
HCT: 30.8 % — ABNORMAL LOW (ref 39.0–52.0)
HCT: 31.5 % — ABNORMAL LOW (ref 39.0–52.0)
Hemoglobin: 10.5 g/dL — ABNORMAL LOW (ref 13.0–17.0)
Hemoglobin: 10.7 g/dL — ABNORMAL LOW (ref 13.0–17.0)
Hemoglobin: 9.8 g/dL — ABNORMAL LOW (ref 13.0–17.0)

## 2013-10-21 LAB — OCCULT BLOOD X 1 CARD TO LAB, STOOL: FECAL OCCULT BLD: POSITIVE — AB

## 2013-10-21 LAB — POTASSIUM: Potassium: 4.2 mEq/L (ref 3.7–5.3)

## 2013-10-22 LAB — CBC
HEMATOCRIT: 29 % — AB (ref 39.0–52.0)
HEMOGLOBIN: 9.9 g/dL — AB (ref 13.0–17.0)
MCH: 35.7 pg — AB (ref 26.0–34.0)
MCHC: 34.1 g/dL (ref 30.0–36.0)
MCV: 104.7 fL — ABNORMAL HIGH (ref 78.0–100.0)
Platelets: 86 10*3/uL — ABNORMAL LOW (ref 150–400)
RBC: 2.77 MIL/uL — AB (ref 4.22–5.81)
RDW: 15.8 % — ABNORMAL HIGH (ref 11.5–15.5)
WBC: 12.7 10*3/uL — ABNORMAL HIGH (ref 4.0–10.5)

## 2013-10-22 LAB — BASIC METABOLIC PANEL
BUN: 96 mg/dL — AB (ref 6–23)
CO2: 18 meq/L — AB (ref 19–32)
Calcium: 7.7 mg/dL — ABNORMAL LOW (ref 8.4–10.5)
Chloride: 110 mEq/L (ref 96–112)
Creatinine, Ser: 2.84 mg/dL — ABNORMAL HIGH (ref 0.50–1.35)
GFR calc Af Amer: 26 mL/min — ABNORMAL LOW (ref >90)
GFR, EST NON AFRICAN AMERICAN: 22 mL/min — AB (ref >90)
GLUCOSE: 213 mg/dL — AB (ref 70–99)
Potassium: 3.6 mEq/L — ABNORMAL LOW (ref 3.7–5.3)
Sodium: 145 mEq/L (ref 137–147)

## 2013-10-22 LAB — HEMOGLOBIN AND HEMATOCRIT, BLOOD
HEMATOCRIT: 27.3 % — AB (ref 39.0–52.0)
HEMATOCRIT: 30.5 % — AB (ref 39.0–52.0)
HEMOGLOBIN: 10.4 g/dL — AB (ref 13.0–17.0)
Hemoglobin: 9.3 g/dL — ABNORMAL LOW (ref 13.0–17.0)

## 2013-10-22 LAB — VANCOMYCIN, TROUGH: VANCOMYCIN TR: 17.4 ug/mL (ref 10.0–20.0)

## 2013-10-23 LAB — BASIC METABOLIC PANEL
BUN: 93 mg/dL — AB (ref 6–23)
CHLORIDE: 113 meq/L — AB (ref 96–112)
CO2: 19 mEq/L (ref 19–32)
Calcium: 7.8 mg/dL — ABNORMAL LOW (ref 8.4–10.5)
Creatinine, Ser: 2.89 mg/dL — ABNORMAL HIGH (ref 0.50–1.35)
GFR, EST AFRICAN AMERICAN: 25 mL/min — AB (ref >90)
GFR, EST NON AFRICAN AMERICAN: 22 mL/min — AB (ref >90)
Glucose, Bld: 195 mg/dL — ABNORMAL HIGH (ref 70–99)
Potassium: 3.4 mEq/L — ABNORMAL LOW (ref 3.7–5.3)
Sodium: 148 mEq/L — ABNORMAL HIGH (ref 137–147)

## 2013-10-23 LAB — HEMOGLOBIN AND HEMATOCRIT, BLOOD
HCT: 30.4 % — ABNORMAL LOW (ref 39.0–52.0)
HEMATOCRIT: 30.6 % — AB (ref 39.0–52.0)
HEMOGLOBIN: 10.3 g/dL — AB (ref 13.0–17.0)
Hemoglobin: 10.2 g/dL — ABNORMAL LOW (ref 13.0–17.0)

## 2013-10-25 LAB — BASIC METABOLIC PANEL
BUN: 92 mg/dL — ABNORMAL HIGH (ref 6–23)
CO2: 16 meq/L — AB (ref 19–32)
Calcium: 7.8 mg/dL — ABNORMAL LOW (ref 8.4–10.5)
Chloride: 107 mEq/L (ref 96–112)
Creatinine, Ser: 2.74 mg/dL — ABNORMAL HIGH (ref 0.50–1.35)
GFR calc Af Amer: 27 mL/min — ABNORMAL LOW (ref >90)
GFR calc non Af Amer: 23 mL/min — ABNORMAL LOW (ref >90)
Glucose, Bld: 222 mg/dL — ABNORMAL HIGH (ref 70–99)
Potassium: 4.1 mEq/L (ref 3.7–5.3)
SODIUM: 141 meq/L (ref 137–147)

## 2013-10-27 ENCOUNTER — Other Ambulatory Visit (HOSPITAL_COMMUNITY): Payer: Self-pay

## 2013-10-27 LAB — CBC WITH DIFFERENTIAL/PLATELET
BASOS ABS: 0 10*3/uL (ref 0.0–0.1)
Basophils Relative: 0 % (ref 0–1)
Eosinophils Absolute: 0 10*3/uL (ref 0.0–0.7)
Eosinophils Relative: 0 % (ref 0–5)
HCT: 29 % — ABNORMAL LOW (ref 39.0–52.0)
Hemoglobin: 9.9 g/dL — ABNORMAL LOW (ref 13.0–17.0)
LYMPHS ABS: 1.8 10*3/uL (ref 0.7–4.0)
Lymphocytes Relative: 10 % — ABNORMAL LOW (ref 12–46)
MCH: 35.5 pg — ABNORMAL HIGH (ref 26.0–34.0)
MCHC: 34.1 g/dL (ref 30.0–36.0)
MCV: 103.9 fL — AB (ref 78.0–100.0)
MONOS PCT: 4 % (ref 3–12)
Monocytes Absolute: 0.7 10*3/uL (ref 0.1–1.0)
NEUTROS PCT: 86 % — AB (ref 43–77)
Neutro Abs: 15 10*3/uL — ABNORMAL HIGH (ref 1.7–7.7)
PLATELETS: 104 10*3/uL — AB (ref 150–400)
RBC: 2.79 MIL/uL — AB (ref 4.22–5.81)
RDW: 16.3 % — AB (ref 11.5–15.5)
WBC: 17.5 10*3/uL — ABNORMAL HIGH (ref 4.0–10.5)

## 2013-10-27 LAB — BASIC METABOLIC PANEL
BUN: 90 mg/dL — ABNORMAL HIGH (ref 6–23)
CALCIUM: 7.9 mg/dL — AB (ref 8.4–10.5)
CO2: 17 meq/L — AB (ref 19–32)
Chloride: 112 mEq/L (ref 96–112)
Creatinine, Ser: 2.72 mg/dL — ABNORMAL HIGH (ref 0.50–1.35)
GFR calc non Af Amer: 23 mL/min — ABNORMAL LOW (ref >90)
GFR, EST AFRICAN AMERICAN: 27 mL/min — AB (ref >90)
Glucose, Bld: 160 mg/dL — ABNORMAL HIGH (ref 70–99)
Potassium: 3.5 mEq/L — ABNORMAL LOW (ref 3.7–5.3)
SODIUM: 145 meq/L (ref 137–147)

## 2013-10-27 LAB — RENAL FUNCTION PANEL
ALBUMIN: 1.5 g/dL — AB (ref 3.5–5.2)
BUN: 91 mg/dL — ABNORMAL HIGH (ref 6–23)
CALCIUM: 8 mg/dL — AB (ref 8.4–10.5)
CO2: 17 mEq/L — ABNORMAL LOW (ref 19–32)
Chloride: 111 mEq/L (ref 96–112)
Creatinine, Ser: 2.71 mg/dL — ABNORMAL HIGH (ref 0.50–1.35)
GFR, EST AFRICAN AMERICAN: 27 mL/min — AB (ref >90)
GFR, EST NON AFRICAN AMERICAN: 24 mL/min — AB (ref >90)
Glucose, Bld: 159 mg/dL — ABNORMAL HIGH (ref 70–99)
POTASSIUM: 3.5 meq/L — AB (ref 3.7–5.3)
Phosphorus: 4.7 mg/dL — ABNORMAL HIGH (ref 2.3–4.6)
SODIUM: 145 meq/L (ref 137–147)

## 2013-10-28 ENCOUNTER — Other Ambulatory Visit (HOSPITAL_COMMUNITY): Payer: Self-pay

## 2013-10-28 LAB — CBC
HCT: 26.1 % — ABNORMAL LOW (ref 39.0–52.0)
Hemoglobin: 8.9 g/dL — ABNORMAL LOW (ref 13.0–17.0)
MCH: 35.5 pg — ABNORMAL HIGH (ref 26.0–34.0)
MCHC: 34.1 g/dL (ref 30.0–36.0)
MCV: 104 fL — ABNORMAL HIGH (ref 78.0–100.0)
PLATELETS: 95 10*3/uL — AB (ref 150–400)
RBC: 2.51 MIL/uL — AB (ref 4.22–5.81)
RDW: 16.8 % — AB (ref 11.5–15.5)
WBC: 17.2 10*3/uL — AB (ref 4.0–10.5)

## 2013-10-28 LAB — BASIC METABOLIC PANEL
BUN: 84 mg/dL — ABNORMAL HIGH (ref 6–23)
CALCIUM: 8 mg/dL — AB (ref 8.4–10.5)
CO2: 19 mEq/L (ref 19–32)
CREATININE: 2.46 mg/dL — AB (ref 0.50–1.35)
Chloride: 114 mEq/L — ABNORMAL HIGH (ref 96–112)
GFR calc non Af Amer: 26 mL/min — ABNORMAL LOW (ref >90)
GFR, EST AFRICAN AMERICAN: 31 mL/min — AB (ref >90)
Glucose, Bld: 138 mg/dL — ABNORMAL HIGH (ref 70–99)
Potassium: 3.7 mEq/L (ref 3.7–5.3)
SODIUM: 148 meq/L — AB (ref 137–147)

## 2013-10-29 ENCOUNTER — Other Ambulatory Visit (HOSPITAL_COMMUNITY): Payer: Self-pay

## 2013-10-29 LAB — CBC WITH DIFFERENTIAL/PLATELET
BASOS PCT: 0 % (ref 0–1)
Basophils Absolute: 0 10*3/uL (ref 0.0–0.1)
Eosinophils Absolute: 0 10*3/uL (ref 0.0–0.7)
Eosinophils Relative: 0 % (ref 0–5)
HCT: 30.2 % — ABNORMAL LOW (ref 39.0–52.0)
HEMOGLOBIN: 10.3 g/dL — AB (ref 13.0–17.0)
LYMPHS ABS: 2 10*3/uL (ref 0.7–4.0)
Lymphocytes Relative: 12 % (ref 12–46)
MCH: 35.6 pg — ABNORMAL HIGH (ref 26.0–34.0)
MCHC: 34.1 g/dL (ref 30.0–36.0)
MCV: 104.5 fL — ABNORMAL HIGH (ref 78.0–100.0)
Monocytes Absolute: 0.4 10*3/uL (ref 0.1–1.0)
Monocytes Relative: 2 % — ABNORMAL LOW (ref 3–12)
NEUTROS ABS: 14.6 10*3/uL — AB (ref 1.7–7.7)
NEUTROS PCT: 86 % — AB (ref 43–77)
Platelets: 117 10*3/uL — ABNORMAL LOW (ref 150–400)
RBC: 2.89 MIL/uL — AB (ref 4.22–5.81)
RDW: 16.9 % — ABNORMAL HIGH (ref 11.5–15.5)
WBC: 17 10*3/uL — ABNORMAL HIGH (ref 4.0–10.5)

## 2013-10-29 LAB — BASIC METABOLIC PANEL
BUN: 72 mg/dL — ABNORMAL HIGH (ref 6–23)
CO2: 17 mEq/L — ABNORMAL LOW (ref 19–32)
Calcium: 8 mg/dL — ABNORMAL LOW (ref 8.4–10.5)
Chloride: 115 mEq/L — ABNORMAL HIGH (ref 96–112)
Creatinine, Ser: 2.25 mg/dL — ABNORMAL HIGH (ref 0.50–1.35)
GFR calc non Af Amer: 30 mL/min — ABNORMAL LOW (ref >90)
GFR, EST AFRICAN AMERICAN: 34 mL/min — AB (ref >90)
Glucose, Bld: 133 mg/dL — ABNORMAL HIGH (ref 70–99)
POTASSIUM: 5.4 meq/L — AB (ref 3.7–5.3)
Sodium: 147 mEq/L (ref 137–147)

## 2013-10-29 LAB — PROCALCITONIN: Procalcitonin: 0.25 ng/mL

## 2013-10-30 DIAGNOSIS — I359 Nonrheumatic aortic valve disorder, unspecified: Secondary | ICD-10-CM

## 2013-10-30 LAB — CBC WITH DIFFERENTIAL/PLATELET
Basophils Absolute: 0 10*3/uL (ref 0.0–0.1)
Basophils Relative: 0 % (ref 0–1)
Eosinophils Absolute: 0 10*3/uL (ref 0.0–0.7)
Eosinophils Relative: 0 % (ref 0–5)
HEMATOCRIT: 26.2 % — AB (ref 39.0–52.0)
HEMOGLOBIN: 9 g/dL — AB (ref 13.0–17.0)
LYMPHS ABS: 1.3 10*3/uL (ref 0.7–4.0)
LYMPHS PCT: 8 % — AB (ref 12–46)
MCH: 35.6 pg — ABNORMAL HIGH (ref 26.0–34.0)
MCHC: 34.4 g/dL (ref 30.0–36.0)
MCV: 103.6 fL — ABNORMAL HIGH (ref 78.0–100.0)
MONO ABS: 0.4 10*3/uL (ref 0.1–1.0)
Monocytes Relative: 3 % (ref 3–12)
NEUTROS ABS: 14.5 10*3/uL — AB (ref 1.7–7.7)
NEUTROS PCT: 90 % — AB (ref 43–77)
Platelets: 122 10*3/uL — ABNORMAL LOW (ref 150–400)
RBC: 2.53 MIL/uL — AB (ref 4.22–5.81)
RDW: 17.3 % — ABNORMAL HIGH (ref 11.5–15.5)
WBC: 16.2 10*3/uL — ABNORMAL HIGH (ref 4.0–10.5)

## 2013-10-30 LAB — BASIC METABOLIC PANEL
BUN: 66 mg/dL — AB (ref 6–23)
CHLORIDE: 119 meq/L — AB (ref 96–112)
CO2: 18 meq/L — AB (ref 19–32)
CREATININE: 2.14 mg/dL — AB (ref 0.50–1.35)
Calcium: 7.9 mg/dL — ABNORMAL LOW (ref 8.4–10.5)
GFR calc Af Amer: 36 mL/min — ABNORMAL LOW (ref >90)
GFR calc non Af Amer: 31 mL/min — ABNORMAL LOW (ref >90)
GLUCOSE: 164 mg/dL — AB (ref 70–99)
POTASSIUM: 3.6 meq/L — AB (ref 3.7–5.3)
Sodium: 154 mEq/L — ABNORMAL HIGH (ref 137–147)

## 2013-10-31 LAB — CBC WITH DIFFERENTIAL/PLATELET
Basophils Absolute: 0 10*3/uL (ref 0.0–0.1)
Basophils Relative: 0 % (ref 0–1)
Eosinophils Absolute: 0 10*3/uL (ref 0.0–0.7)
Eosinophils Relative: 0 % (ref 0–5)
HCT: 28.4 % — ABNORMAL LOW (ref 39.0–52.0)
Hemoglobin: 9.7 g/dL — ABNORMAL LOW (ref 13.0–17.0)
LYMPHS ABS: 1.4 10*3/uL (ref 0.7–4.0)
LYMPHS PCT: 8 % — AB (ref 12–46)
MCH: 35.1 pg — AB (ref 26.0–34.0)
MCHC: 34.2 g/dL (ref 30.0–36.0)
MCV: 102.9 fL — ABNORMAL HIGH (ref 78.0–100.0)
MONO ABS: 0.7 10*3/uL (ref 0.1–1.0)
Monocytes Relative: 4 % (ref 3–12)
Neutro Abs: 16 10*3/uL — ABNORMAL HIGH (ref 1.7–7.7)
Neutrophils Relative %: 88 % — ABNORMAL HIGH (ref 43–77)
PLATELETS: 122 10*3/uL — AB (ref 150–400)
RBC: 2.76 MIL/uL — AB (ref 4.22–5.81)
RDW: 17.1 % — AB (ref 11.5–15.5)
WBC: 18.1 10*3/uL — AB (ref 4.0–10.5)

## 2013-10-31 LAB — BASIC METABOLIC PANEL
BUN: 62 mg/dL — ABNORMAL HIGH (ref 6–23)
CALCIUM: 7.7 mg/dL — AB (ref 8.4–10.5)
CHLORIDE: 115 meq/L — AB (ref 96–112)
CO2: 18 mEq/L — ABNORMAL LOW (ref 19–32)
CREATININE: 1.93 mg/dL — AB (ref 0.50–1.35)
GFR calc Af Amer: 41 mL/min — ABNORMAL LOW (ref >90)
GFR calc non Af Amer: 36 mL/min — ABNORMAL LOW (ref >90)
GLUCOSE: 192 mg/dL — AB (ref 70–99)
Potassium: 4.2 mEq/L (ref 3.7–5.3)
Sodium: 148 mEq/L — ABNORMAL HIGH (ref 137–147)

## 2013-10-31 LAB — MAGNESIUM: MAGNESIUM: 1.9 mg/dL (ref 1.5–2.5)

## 2013-11-01 LAB — COMPREHENSIVE METABOLIC PANEL
ALT: 61 U/L — ABNORMAL HIGH (ref 0–53)
AST: 73 U/L — AB (ref 0–37)
Albumin: 1.5 g/dL — ABNORMAL LOW (ref 3.5–5.2)
Alkaline Phosphatase: 414 U/L — ABNORMAL HIGH (ref 39–117)
BILIRUBIN TOTAL: 1 mg/dL (ref 0.3–1.2)
BUN: 63 mg/dL — AB (ref 6–23)
CALCIUM: 7.9 mg/dL — AB (ref 8.4–10.5)
CO2: 19 mEq/L (ref 19–32)
CREATININE: 1.87 mg/dL — AB (ref 0.50–1.35)
Chloride: 111 mEq/L (ref 96–112)
GFR calc Af Amer: 43 mL/min — ABNORMAL LOW (ref >90)
GFR calc non Af Amer: 37 mL/min — ABNORMAL LOW (ref >90)
Glucose, Bld: 148 mg/dL — ABNORMAL HIGH (ref 70–99)
Potassium: 2.8 mEq/L — CL (ref 3.7–5.3)
Sodium: 145 mEq/L (ref 137–147)
TOTAL PROTEIN: 6.5 g/dL (ref 6.0–8.3)

## 2013-11-01 LAB — CBC WITH DIFFERENTIAL/PLATELET
BASOS ABS: 0 10*3/uL (ref 0.0–0.1)
Basophils Relative: 0 % (ref 0–1)
EOS ABS: 0 10*3/uL (ref 0.0–0.7)
EOS PCT: 0 % (ref 0–5)
HEMATOCRIT: 29.4 % — AB (ref 39.0–52.0)
HEMOGLOBIN: 10.2 g/dL — AB (ref 13.0–17.0)
Lymphocytes Relative: 7 % — ABNORMAL LOW (ref 12–46)
Lymphs Abs: 1.5 10*3/uL (ref 0.7–4.0)
MCH: 35.3 pg — AB (ref 26.0–34.0)
MCHC: 34.7 g/dL (ref 30.0–36.0)
MCV: 101.7 fL — ABNORMAL HIGH (ref 78.0–100.0)
MONO ABS: 0.8 10*3/uL (ref 0.1–1.0)
MONOS PCT: 3 % (ref 3–12)
Neutro Abs: 20 10*3/uL — ABNORMAL HIGH (ref 1.7–7.7)
Neutrophils Relative %: 90 % — ABNORMAL HIGH (ref 43–77)
Platelets: 118 10*3/uL — ABNORMAL LOW (ref 150–400)
RBC: 2.89 MIL/uL — ABNORMAL LOW (ref 4.22–5.81)
RDW: 16.7 % — AB (ref 11.5–15.5)
WBC: 22.4 10*3/uL — ABNORMAL HIGH (ref 4.0–10.5)

## 2013-11-01 LAB — PREALBUMIN: PREALBUMIN: 9.3 mg/dL — AB (ref 17.0–34.0)

## 2013-11-02 ENCOUNTER — Other Ambulatory Visit (HOSPITAL_COMMUNITY): Payer: Self-pay

## 2013-11-02 LAB — BASIC METABOLIC PANEL
BUN: 70 mg/dL — ABNORMAL HIGH (ref 6–23)
CO2: 14 mEq/L — ABNORMAL LOW (ref 19–32)
Calcium: 7.7 mg/dL — ABNORMAL LOW (ref 8.4–10.5)
Chloride: 110 mEq/L (ref 96–112)
Creatinine, Ser: 1.96 mg/dL — ABNORMAL HIGH (ref 0.50–1.35)
GFR calc Af Amer: 40 mL/min — ABNORMAL LOW (ref >90)
GFR, EST NON AFRICAN AMERICAN: 35 mL/min — AB (ref >90)
Glucose, Bld: 241 mg/dL — ABNORMAL HIGH (ref 70–99)
POTASSIUM: 3.3 meq/L — AB (ref 3.7–5.3)
Sodium: 142 mEq/L (ref 137–147)

## 2013-11-02 LAB — BLOOD GAS, ARTERIAL
Acid-base deficit: 9.3 mmol/L — ABNORMAL HIGH (ref 0.0–2.0)
Acid-base deficit: 8.5 mmol/L — ABNORMAL HIGH (ref 0.0–2.0)
BICARBONATE: 14.9 meq/L — AB (ref 20.0–24.0)
Bicarbonate: 15.3 mEq/L — ABNORMAL LOW (ref 20.0–24.0)
FIO2: 100 %
O2 Content: 7 L/min
O2 Saturation: 84.6 %
O2 Saturation: 92.2 %
PCO2 ART: 26 mmHg — AB (ref 35.0–45.0)
PCO2 ART: 25.1 mmHg — AB (ref 35.0–45.0)
PH ART: 7.375 (ref 7.350–7.450)
PO2 ART: 54.5 mmHg — AB (ref 80.0–100.0)
Patient temperature: 98.6
Patient temperature: 98.6
TCO2: 15.7 mmol/L (ref 0–100)
TCO2: 16.1 mmol/L (ref 0–100)
pH, Arterial: 7.403 (ref 7.350–7.450)
pO2, Arterial: 66.1 mmHg — ABNORMAL LOW (ref 80.0–100.0)

## 2013-11-02 LAB — BODY FLUID CELL COUNT WITH DIFFERENTIAL: Total Nucleated Cell Count, Fluid: 6 cu mm (ref 0–1000)

## 2013-11-02 LAB — LACTATE DEHYDROGENASE, PLEURAL OR PERITONEAL FLUID: LD FL: 65 U/L — AB (ref 3–23)

## 2013-11-02 LAB — CBC WITH DIFFERENTIAL/PLATELET
Basophils Absolute: 0 10*3/uL (ref 0.0–0.1)
Basophils Relative: 0 % (ref 0–1)
Eosinophils Absolute: 0 10*3/uL (ref 0.0–0.7)
Eosinophils Relative: 0 % (ref 0–5)
HCT: 29.2 % — ABNORMAL LOW (ref 39.0–52.0)
HEMOGLOBIN: 10 g/dL — AB (ref 13.0–17.0)
Lymphocytes Relative: 6 % — ABNORMAL LOW (ref 12–46)
Lymphs Abs: 1.4 10*3/uL (ref 0.7–4.0)
MCH: 35 pg — ABNORMAL HIGH (ref 26.0–34.0)
MCHC: 34.2 g/dL (ref 30.0–36.0)
MCV: 102.1 fL — ABNORMAL HIGH (ref 78.0–100.0)
MONOS PCT: 2 % — AB (ref 3–12)
Monocytes Absolute: 0.4 10*3/uL (ref 0.1–1.0)
NEUTROS PCT: 93 % — AB (ref 43–77)
Neutro Abs: 22.3 10*3/uL — ABNORMAL HIGH (ref 1.7–7.7)
PLATELETS: 102 10*3/uL — AB (ref 150–400)
RBC: 2.86 MIL/uL — ABNORMAL LOW (ref 4.22–5.81)
RDW: 16.9 % — ABNORMAL HIGH (ref 11.5–15.5)
WBC: 24 10*3/uL — ABNORMAL HIGH (ref 4.0–10.5)

## 2013-11-02 LAB — VANCOMYCIN, TROUGH: Vancomycin Tr: 20.9 ug/mL — ABNORMAL HIGH (ref 10.0–20.0)

## 2013-11-02 LAB — PROCALCITONIN: Procalcitonin: 0.5 ng/mL

## 2013-11-03 DIAGNOSIS — J9600 Acute respiratory failure, unspecified whether with hypoxia or hypercapnia: Secondary | ICD-10-CM

## 2013-11-03 DIAGNOSIS — N179 Acute kidney failure, unspecified: Secondary | ICD-10-CM

## 2013-11-03 DIAGNOSIS — K7200 Acute and subacute hepatic failure without coma: Secondary | ICD-10-CM

## 2013-11-03 LAB — CBC WITH DIFFERENTIAL/PLATELET
BASOS ABS: 0 10*3/uL (ref 0.0–0.1)
Basophils Relative: 0 % (ref 0–1)
EOS PCT: 0 % (ref 0–5)
Eosinophils Absolute: 0 10*3/uL (ref 0.0–0.7)
HCT: 32.9 % — ABNORMAL LOW (ref 39.0–52.0)
Hemoglobin: 11.5 g/dL — ABNORMAL LOW (ref 13.0–17.0)
Lymphocytes Relative: 6 % — ABNORMAL LOW (ref 12–46)
Lymphs Abs: 1.1 10*3/uL (ref 0.7–4.0)
MCH: 35.4 pg — AB (ref 26.0–34.0)
MCHC: 35 g/dL (ref 30.0–36.0)
MCV: 101.2 fL — ABNORMAL HIGH (ref 78.0–100.0)
MONOS PCT: 3 % (ref 3–12)
Monocytes Absolute: 0.5 10*3/uL (ref 0.1–1.0)
NEUTROS PCT: 91 % — AB (ref 43–77)
Neutro Abs: 16.3 10*3/uL — ABNORMAL HIGH (ref 1.7–7.7)
Platelets: 78 10*3/uL — ABNORMAL LOW (ref 150–400)
RBC: 3.25 MIL/uL — ABNORMAL LOW (ref 4.22–5.81)
RDW: 16.5 % — AB (ref 11.5–15.5)
WBC: 17.9 10*3/uL — AB (ref 4.0–10.5)

## 2013-11-03 LAB — BASIC METABOLIC PANEL
BUN: 76 mg/dL — ABNORMAL HIGH (ref 6–23)
CO2: 15 meq/L — AB (ref 19–32)
Calcium: 8.2 mg/dL — ABNORMAL LOW (ref 8.4–10.5)
Chloride: 112 mEq/L (ref 96–112)
Creatinine, Ser: 2.12 mg/dL — ABNORMAL HIGH (ref 0.50–1.35)
GFR calc non Af Amer: 32 mL/min — ABNORMAL LOW (ref >90)
GFR, EST AFRICAN AMERICAN: 37 mL/min — AB (ref >90)
Glucose, Bld: 174 mg/dL — ABNORMAL HIGH (ref 70–99)
Potassium: 4.3 mEq/L (ref 3.7–5.3)
SODIUM: 143 meq/L (ref 137–147)

## 2013-11-03 LAB — BLOOD GAS, ARTERIAL
ACID-BASE DEFICIT: 8.9 mmol/L — AB (ref 0.0–2.0)
Bicarbonate: 15.4 mEq/L — ABNORMAL LOW (ref 20.0–24.0)
FIO2: 1 %
O2 Saturation: 90.5 %
PATIENT TEMPERATURE: 98.6
PO2 ART: 63.3 mmHg — AB (ref 80.0–100.0)
TCO2: 16.3 mmol/L (ref 0–100)
pCO2 arterial: 27.6 mmHg — ABNORMAL LOW (ref 35.0–45.0)
pH, Arterial: 7.366 (ref 7.350–7.450)

## 2013-11-03 NOTE — Procedures (Signed)
Successful US guided paracentesis from RLQ.  Yielded 3.8 liters of cloudy yellow colored fluid.  No immediate complications.  Pt tolerated well.   Specimen was sent for labs.  Pattricia BossMORGAN, Leyla Soliz D PA-C 11/03/2013 10:38 AM

## 2013-11-03 NOTE — Consult Note (Signed)
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 [facility]. He has since been able to come off from dialysis, however his course has been complicated by profound deconditioning, aspiration pneumonia, and now acute respiratory failure which is the reason for pulmonary consultation on 1/28  SIGNIFICANT EVENTS / STUDIES:  1/27 paracentesis 3 L removed  LINES / TUBES: Right internal jugular vein hemodialysis catheter placed at [facility]  CULTURES: 1/27: Ascites: No organisms. Blood culture x21/23:>>> 1/15 C. Difficile PCR negative. 1/11 urine culture positive for e coli  ANTIBIOTICS: Meropenem 1/23>>> Vancomycin 1/23>>>>  HISTORY OF PRESENT ILLNESS:   This is a 63 year old male patient who [facility] for further supportive care of acute renal failure in the setting of acute on chronic liver disease. Patient has a history of liver cirrhosis in the setting of hepatitis C, also prior stroke with left-sided weakness, and coronary artery disease. He was in recently [facility] in the setting of jaundice and pruritus as well as increase weakness  And fatigue. Diagnostic evaluation was consistent with acute liver failure and acute renal failure it was not clear at that time whether or not this was related to acute tubular necrosis or hepato- renal syndrome. His renal function continued to worsen,  And he was started on dialysis. Since admission [facility] has [facility] with focus on rehabilitation efforts, and supportive care in regards  to his renal failure. He has been off dialysis since early January, his renal function has been supported with maintaining adequate hydration. His hospital course has been complicated by aspiration pneumonia, generalized weakness, and ascites. He had a therapeutic paracentesis performed on 1/27. As of 1/25 he was noted to require increasing supplemental oxygen, which progressed to the point on 1/27 where he required 100% FiO2 support. PCCM was consult and at this time for critical care services.   PAST MEDICAL HISTORY :  Hepatitis C with progression to cirrhosis He's had a recent liver biopsy which did not show any autoimmune pattern but only changes worsening cirrhosis and fibrosis.  History of CVA with left-sided weakness B12 deficiency history coronary artery disease recent admit for acute on chronic renal failure   medications   Meropenem, Aranesp, cholestyramine, Nephro-Vite, metoprolol, prednisone, Protonix, sertraline, sodium bicarbonate, torsemide, vancomycin.     FAMILY HISTORY:  History of prostate cancer SOCIAL HISTORY: Was a smoker, and toes most recent hospitalization, has remote history of cocaine abuse, stopped drinking 3 years prior. REVIEW OF SYSTEMS:   unable  SUBJECTIVE:  Respirations are labored VITAL SIGNS: BP: (111-119)/(76-80) 115/77 mmHg (01/27 1507)  PHYSICAL EXAMINATION: General:  Chronically ill-appearing African American male currently in acute distress with paradoxical respiratory efforts Neuro:  Awake, speech slurred, oriented x1 HEENT:  Mucous membranes are dry, neck veins are flat Cardiovascular:  RRR Lungs:  Diffuse rhonchi positive accessory muscle use Abdomen:  Shifting dullness to percussion, abdomen distended hypoactive bowel sounds Foley catheter in place yellow concentrated urine Musculoskeletal:  Generalized weakness Skin:  intact   Recent Labs Lab 11/01/13 0640 11/02/13 0915 11/03/13 0515  NA 145 142 143  K 2.8* 3.3* 4.3  CL 111 110  112  CO2 19 14* 15*  BUN 63* 70* 76*  CREATININE 1.87* 1.96* 2.12*  GLUCOSE 148* 241* 174*    Recent Labs Lab 11/01/13 0640 11/02/13 0915 11/03/13 0515  HGB 10.2* 10.0* 11.5*  HCT 29.4* 29.2* 32.9*  WBC 22.4* 24.0* 17.9*  PLT 118* 102* 78*   Dg Chest 1 View  11/02/2013   CLINICAL DATA:  Respiratory failure, weakness  EXAM: CHEST - 1 VIEW  COMPARISON:  Prior chest x-ray 10/27/2013; CT chest 10/28/2013  FINDINGS: Stable position of right IJ approach tunneled hemodialysis catheter. Catheter tips project over the superior cavoatrial junction. Incompletely imaged feeding tube. The weighted tip projects over the gastric body. Stable cardiomegaly. Increased diffuse bilateral interstitial and alveolar airspace opacities in a predominantly basilar and perihilar distribution. Calcified left hilar adenopathy. 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 Left IJ central venous catheter. The catheter tip projects over the distal left innominate vein at its junction with the SVC. No acute osseous abnormality.  IMPRESSION: Significantly increased bilateral interstitial and airspace opacities in a predominantly bibasilar and perihilar distribution. Findings are favored to reflect acute pulmonary edema. Additional considerations include worsening of multifocal pneumonia, and potentially diffuse alveolar hemorrhage or aspiration although these are considered less likely.  The tip of the enteric feeding tube projects over the stomach. If transpyloric placement is desired, recommend advancing.   Electronically Signed   By: Malachy Moan M.D.   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 He is in acute respiratory failure. He is a terrible candidate for mechanical ventilation, should we escalate to this level of support his chances of coming off the ventilator and staying off from the ventilator are very poor. He has continued to worsen in spite of appropriate good supportive medicine, and the chances of functional recovery at this point would be very low. Given his end-stage liver disease, he is not a candidate for tracheostomy, if he were to be intubated, and require prolonged ventilation. From a critical care standpoint our recommendations are for palliative focus. Recommendations/plan Continue supplemental oxygen He is not a candidate for noninvasive ventilation given his risk for aspiration Continue empiric antibiotics Continue diuresis We will meet with family, they have been notified of the patient's critical condition. We will strongly urge transition to supportive/palliative focus and care. It is our belief that escalation to full ventilatory support would be a guarantee for prolonged poor health, with very little hope for improvement.  Patient is in obvious respiratory failure, however, given renal failure and more importantly liver failure, would absolutely not recommend life support.  Would recommend full comfort care at this point and will discuss with family when they arrive.  CC time 35 min.  Alyson Reedy, M.D. Pulmonary and Critical Care Medicine [facility] Pager: [phone]  11/03/2013, 11:21 AM

## 2013-11-04 LAB — CULTURE, BLOOD (ROUTINE X 2)
CULTURE: NO GROWTH
Culture: NO GROWTH

## 2013-11-04 LAB — VANCOMYCIN, RANDOM: Vancomycin Rm: 15.5 ug/mL

## 2013-11-04 LAB — PHOSPHORUS: Phosphorus: 5.5 mg/dL — ABNORMAL HIGH (ref 2.3–4.6)

## 2013-11-04 LAB — CLOSTRIDIUM DIFFICILE BY PCR: Toxigenic C. Difficile by PCR: NEGATIVE

## 2013-11-06 LAB — BODY FLUID CULTURE
CULTURE: NO GROWTH
Gram Stain: NONE SEEN

## 2013-11-07 DEATH — deceased

## 2015-01-27 NOTE — Consult Note (Signed)
Chief Complaint:  Subjective/Chief Complaint Pt denies abdominal pain, nausea, or vomiting. Eating well.  Refused labs this AM but agreed to have drawn after our discussion.   VITAL SIGNS/ANCILLARY NOTES: **Vital Signs.:   10-Dec-14 01:43  Vital Signs Type Q 4hr  Temperature Temperature (F) 97.6  Temperature Source oral  Pulse Pulse 76  Respirations Respirations 18  Systolic BP Systolic BP 333  Diastolic BP (mmHg) Diastolic BP (mmHg) 67  Mean BP 78  Pulse Ox % Pulse Ox % 100  Pulse Ox Activity Level  At rest  Oxygen Delivery Room Air/ 21 %    05:55  Vital Signs Type Routine  Temperature Temperature (F) 97.7  Celsius 36.5  Temperature Source oral  Pulse Pulse 81  Respirations Respirations 18  Systolic BP Systolic BP 832  Diastolic BP (mmHg) Diastolic BP (mmHg) 67  Mean BP 80  Pulse Ox % Pulse Ox % 100  Pulse Ox Activity Level  At rest  Oxygen Delivery Room Air/ 21 %    09:00  Telemetry pattern Cardiac Rhythm Normal sinus rhythm; pattern reported by Telemetry Clerk   Brief Assessment:  GEN no acute distress, thin, A/Ox3. sitting up in bed eating breakfast   Cardiac Regular  murmur present   Respiratory normal resp effort   Gastrointestinal details normal Soft  Nontender  Nondistended  Bowel sounds normal   EXTR +clubbing, no edema bilat   Additional Physical Exam Skin: warm, dry intact HEENT: mild scleral icterus.   Lab Results: Hepatic:  10-Dec-14 00:00   Bilirubin, Total -  Alkaline Phosphatase - (45-117 NOTE: New Reference Range 08/27/13)  SGPT (ALT) -  SGOT (AST) -  Total Protein, Serum -  Albumin, Serum -    10:04   Bilirubin, Total  12.3  Bilirubin, Total -  Alkaline Phosphatase  370 (45-117 NOTE: New Reference Range 08/27/13)  Alkaline Phosphatase - (45-117 NOTE: New Reference Range 08/27/13)  SGPT (ALT)  144  SGPT (ALT) -  SGOT (AST)  190  SGOT (AST) -  Total Protein, Serum  5.9  Total Protein, Serum -  Albumin, Serum  1.3  Albumin,  Serum -  Routine Chem:  10-Dec-14 00:00   Glucose, Serum -  BUN -  Creatinine (comp) -  Sodium, Serum -  Potassium, Serum -  Chloride, Serum -  CO2, Serum -  Calcium (Total), Serum -  Osmolality (calc) -  eGFR (African American) -  eGFR (Non-African American) - (eGFR values <52m/min/1.73 m2 may be an indication of chronic kidney disease (CKD). Calculated eGFR is useful in patients with stable renal function. The eGFR calculation will not be reliable in acutely ill patients when serum creatinine is changing rapidly. It is not useful in  patients on dialysis. The eGFR calculation may not be applicable to patients at the low and high extremes of body sizes, pregnant women, and vegetarians.)  Anion Gap -  Result Comment LABS - VERIFIED IN ERROR TESTS REORDERED  Result(s) reported on 15 Sep 2013 at 09:46AM.    10:04   Glucose, Serum  119  Glucose, Serum -  BUN  76  BUN -  Creatinine (comp)  3.73  Creatinine (comp) -  Sodium, Serum 141  Sodium, Serum -  Potassium, Serum  2.9  Potassium, Serum -  Chloride, Serum  108  Chloride, Serum -  CO2, Serum 21  CO2, Serum -  Calcium (Total), Serum  8.0  Calcium (Total), Serum -  Osmolality (calc) 305  Osmolality (calc) -  eGFR (African  American)  19  eGFR (African American) -  eGFR (Non-African American)  16 (eGFR values <73m/min/1.73 m2 may be an indication of chronic kidney disease (CKD). Calculated eGFR is useful in patients with stable renal function. The eGFR calculation will not be reliable in acutely ill patients when serum creatinine is changing rapidly. It is not useful in  patients on dialysis. The eGFR calculation may not be applicable to patients at the low and high extremes of body sizes, pregnant women, and vegetarians.)  eGFR (Non-African American) - (eGFR values <660mmin/1.73 m2 may be an indication of chronic kidney disease (CKD). Calculated eGFR is useful in patients with stable renal function. The eGFR  calculation will not be reliable in acutely ill patients when serum creatinine is changing rapidly. It is not useful in  patients on dialysis. The eGFR calculation may not be applicable to patients at the low and high extremes of body sizes, pregnant women, and vegetarians.)  Anion Gap 12  Anion Gap -  Result Comment Labs - Received in computer by mistake.  - Reordered for 09/16/13.  Result(s) reported on 15 Sep 2013 at 10:31AM.  Phosphorus, Serum 4.3 (Result(s) reported on 15 Sep 2013 at 11:03AM.)  Routine Coag:  10-Dec-14 00:00   Activated PTT (APTT) - (A HCT value >55% may artifactually increase the APTT. In one study, the increase was an average of 19%. Reference: "Effect on Routine and Special Coagulation Testing Values of Citrate Anticoagulant Adjustment in Patients with High HCT Values." American Journal of Clinical Pathology 2006;126:400-405.)    10:04   Activated PTT (APTT)  53.4 (A HCT value >55% may artifactually increase the APTT. In one study, the increase was an average of 19%. Reference: "Effect on Routine and Special Coagulation Testing Values of Citrate Anticoagulant Adjustment in Patients with High HCT Values." American Journal of Clinical Pathology 2006;126:400-405.)  Routine Hem:  10-Dec-14 00:00   RBC (CBC) -  Hemoglobin (CBC) -  Hematocrit (CBC) -  Platelet Count (CBC) -  MCV -  MCH -  MCHC -  RDW -  WBC (CBC) -  Neutrophil % -  Lymphocyte % -  Monocyte % -  Eosinophil % -  Basophil % -  Neutrophil # -  Lymphocyte # -  Monocyte # -  Eosinophil # -  Basophil # -  Bands -  Segmented Neutrophils -  Lymphocytes -  Variant Lymphocytes -  Monocytes -  Eosinophil -  Basophil -  Metamyelocyte -  Myelocyte -  Promyelocyte -  Blast-Like -  Other Cells -  NRBC -  Diff Comment 1 -  Diff Comment 2 -  Diff Comment 3 -  Diff Comment 4 -  Diff Comment 5 -  Diff Comment 6 -  Diff Comment 7 -  Diff Comment 8 -  Diff Comment 9 -  Diff Comment 10 -  (Result(s) reported on 15 Sep 2013 at 09:46AM.)    10:04   RBC (CBC)  3.04  RBC (CBC) -  Hemoglobin (CBC)  10.7  Hemoglobin (CBC) -  Hematocrit (CBC)  31.0  Hematocrit (CBC) -  Platelet Count (CBC)  116 (Result(s) reported on 15 Sep 2013 at 11:06AM.)  Platelet Count (CBC) -  MCV  102  MCV -  MCH  35.0  MCH -  MCHC 34.3  MCHC -  RDW  23.3  RDW -  WBC (CBC) -  Neutrophil % -  Lymphocyte % -  Monocyte % -  Eosinophil % -  Basophil % -  Neutrophil # -  Lymphocyte # -  Monocyte # -  Eosinophil # -  Basophil # -  Bands -  Segmented Neutrophils -  Lymphocytes -  Variant Lymphocytes -  Monocytes -  Eosinophil -  Basophil -  Metamyelocyte -  Myelocyte -  Promyelocyte -  Blast-Like -  Other Cells -  NRBC -  Diff Comment 1 -  Diff Comment 2 -  Diff Comment 3 -  Diff Comment 4 -  Diff Comment 5 -  Diff Comment 6 -  Diff Comment 7 -  Diff Comment 8 -  Diff Comment 9 -  Diff Comment 10 - (Result(s) reported on 15 Sep 2013 at 10:31AM.)   Assessment/Plan:  Assessment/Plan:  Assessment 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  Discussed transjugular liver biopsy with Dr Allen Norris, Dr Kathlene Cote (IR), Dr Posey Pronto & pt.  Discussed risks/benefits of procedure to help guide our treatment of his liver disease which will again be discussed by performing radiologist tomorrow. Patient agrees with this plan & consent will be obtained. Small pancreatic head cyst: FU MRCP to look for IPMN in 6 mo.   Plan 1) FU MRCP in 6 mo 2) continue supportive measures 3) TJ Liver bx tomorrow by Dr Kathlene Cote 4) K repletion per attending Please call if you have any questions or concerns   Electronic Signatures: Andria Meuse (NP)  (Signed 10-Dec-14 11:26)  Authored: Chief Complaint, VITAL SIGNS/ANCILLARY NOTES, Brief Assessment, Lab Results, Assessment/Plan   Last Updated: 10-Dec-14 11:26 by Andria Meuse (NP)

## 2015-01-27 NOTE — Consult Note (Signed)
PATIENT NAME:  Tommy Simpson, Tommy Simpson MR#:  [id number] DATE OF BIRTH:  1951-10-02  DATE OF CONSULTATION:  09/06/2013  REFERRING PHYSICIAN:  Farmland Sink, MD CONSULTING PHYSICIAN:  Lucilla Lame, MD; Andria Meuse, NP PRIMARY CARE PHYSICIAN: Not applicable.  REASON FOR CONSULTATION: Jaundice.   HISTORY OF PRESENT ILLNESS: [name] is a 64 year old black male who has been having fatigue and malaise for the past several weeks. It has become worse over the last week. He denies any abdominal pain. He became jaundiced. He was having clay-colored stools along with some intermittent diarrhea and constipation. He also has had significant pruritus that has worsened over the last 3 weeks. He denies any fever or chills. He did complete radiation treatment for prostate cancer back in June under the direction of Dr. Baruch Gouty. He says he rarely consumes alcohol since 2002 after he had a CVA. He does occasionally drink a beer. He reports a history of heavy alcohol. He was admitted with a total bilirubin of 24.8, direct 19.6, alkaline phosphatase 706, AST 504, ALT 283. He has an MCV of 105. He denies any IV drug use, but he did use intranasal drugs many years ago. He denies any history of transfusion. He denies any new medications. He denies any pain medicines or acetaminophen at home. He denies any hazardous work exposure. His white blood cell count is 14.4. He had an ultrasound which showed a common bile duct of 4.5 mm, gallbladder was not visualized, and a fatty liver. He denies any abdominal pain, nausea. or vomiting. He denies any history of ascites. He denies any history of liver disease. He has seen occasional scant amounts of bright red blood in his stools and with wiping. He is on pravastatin at home.  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 He believes he has had a colonoscopy a couple of years ago but cannot give me details.   MEDICATIONS PRIOR TO ADMISSION: Aspirin 81 mg daily, hydrochlorothiazide 25 mg daily, lisinopril 10 mg daily, metoprolol 25 mg b.i.d., niacin 250 mg extended-release daily, pravastatin 40 mg at bedtime.   ALLERGIES: No known drug allergies.   FAMILY HISTORY: There is no known family history of colorectal carcinoma, liver or chronic GI disease.   SOCIAL HISTORY: He reports a lifelong history of tobacco abuse, smoking about 6 cigarettes per day. Remote history of heavy alcohol use, however, states he currently consumes a couple of beers occasionally. He has a remote history of intranasal drug use/crack but had not used in 5 years. He denies any IV drug use. He is married. He does not have any children. He is disabled. He previously was employed at [employer].   REVIEW OF SYSTEMS: See HPI. PULMONARY: He has had some shortness of breath on exertion and coughing, nonproductive chronic cough.  GENERAL: He has significant weakness.   Otherwise negative complete 12-point review of systems.   PHYSICAL EXAMINATION: VITAL SIGNS: Temperature 97.5, pulse 74, respirations 20, blood pressure 146/87.  GENERAL: He is an elderly black male who is alert, oriented, pleasant, cooperative, in no acute distress.  HEENT: He has scleral icterus. Conjunctivae pale. Oropharynx pink and moist. He is edentulous.  NECK: Supple without any mass or thyromegaly.  HEART: Regular rate and rhythm with a III/VI murmur noted.  LUNGS: With mild expiratory wheezes bilaterally. No acute distress.  ABDOMEN: Multiple  dry annular raised papules to his abdomen with excoriation. He has positive bowel sounds x 4. No bruits auscultated. Abdomen is soft, nontender, nondistended, without palpable mass or hepatosplenomegaly. No rebound tenderness or guarding.  EXTREMITIES: He has clubbing. No  edema.  SKIN: Dry. He has diffuse scaling hypopigmented with depressed centered rash throughout his entire body with surrounding excoriations from itching.  NEUROLOGIC: Grossly intact.  PSYCHIATRIC: He has a normal mood and affect.  MUSCULOSKELETAL: Good equal strength and movement bilaterally.   LABORATORY STUDIES: Glucose 114, BUN 81, creatinine 3.39, potassium 2.4, CO2 of 20, otherwise normal met-7. LFTs as per HPI. INR 1.4. His hemoglobin, hematocrit, and platelet count are normal.   IMPRESSION: [name] is a pleasant 64 year old black male admitted with profound painless jaundice. Bilirubin elevated in obstructive pattern, likely hepatocellular with significant transaminitis as well. The patient has malaise, pruritus, dark urine, and clay-colored stools for a  couple of weeks, worsening over the past few days. Denies any recent alcohol use, although he does report an occasional beer. History of intranasal drug use. Denies IV drug use. Ultrasound shows fatty liver without mass. No common bile duct dilation. History of prostate cancer, status post radiation treatment in June 2014, felt to be in remission. He denies any new medications or significant acetaminophen use. Differentials include malignancy (closet), EtOH use, viral hepatitis, or less likely other less common liver diseases.   PLAN: 1.  Agree with CT.  2.  Agree with acetaminophen, serum alcohol, ferritin, acute hepatitis panel, HIV, PSA, and urine drug screen. 3.  Consider AMA, ANA, ceruloplasmin, immunoglobulins, anti-SMA pending above results. 4.  Potassium repletion per attending.   I have discussed this case with Dr. Laurin Coder, who is attending.  Thank you for allowing [place] to participate in the care of [name].  ____________________________ Andria Meuse, NP klj:jcm D: 09/06/2013 17:01:52 ET T: 09/06/2013 17:39:53 ET JOB#: [id number]  cc: Andria Meuse, NP, <Dictator> Andria Meuse FNP ELECTRONICALLY SIGNED  09/13/2013 15:50

## 2015-01-27 NOTE — Consult Note (Signed)
Chief Complaint:  Subjective/Chief Complaint Pt denies abdominal pain, nausea, or vomiting. Eating well.   VITAL SIGNS/ANCILLARY NOTES: **Vital Signs.:   09-Dec-14 12:35  Vital Signs Type Post Dialysis  Temperature Temperature (F) 97.3  Celsius 36.2  Temperature Source oral  Pulse Pulse 87  Respirations Respirations 18  Systolic BP Systolic BP 97  Diastolic BP (mmHg) Diastolic BP (mmHg) 68  Mean BP 77  Pulse Ox % Pulse Ox % 99  Pulse Ox Activity Level  At rest  Oxygen Delivery Room Air/ 21 %   Brief Assessment:  GEN no acute distress, thin, A/Ox3. +sleepy   Cardiac Regular  murmur present   Respiratory normal resp effort   Gastrointestinal details normal Soft  Nontender  Nondistended  Bowel sounds normal   EXTR +clubbing, no edema bilat   Additional Physical Exam Skin: warm, dry intact HEENT: mild scleral icterus.   Lab Results:  Hepatic:  09-Dec-14 08:44   Bilirubin, Total  12.5  Bilirubin, Direct  9.80 (Result(s) reported on 14 Sep 2013 at 12:26PM.)  Alkaline Phosphatase  388 (45-117 NOTE: New Reference Range 08/27/13)  SGPT (ALT)  153  SGOT (AST)  188  Total Protein, Serum  6.0  Albumin, Serum  1.3  Routine Chem:  09-Dec-14 08:44   Phosphorus, Serum  6.2 (Result(s) reported on 14 Sep 2013 at 10:56AM.)  Routine Coag:  09-Dec-14 08:44   Prothrombin  21.2  INR 1.9 (INR reference interval applies to patients on anticoagulant therapy. A single INR therapeutic range for coumarins is not optimal for all indications; however, the suggested range for most indications is 2.0 - 3.0. Exceptions to the INR Reference Range may include: Prosthetic heart valves, acute myocardial infarction, prevention of myocardial infarction, and combinations of aspirin and anticoagulant. The need for a higher or lower target INR must be assessed individually. Reference: The Pharmacology and Management of the Vitamin K  antagonists: the seventh ACCP Conference on Antithrombotic  and Thrombolytic Therapy. Chest.2004 Sept:126 (3suppl): [id number]. A HCT value >55% may artifactually increase the PT.  In one study,  the increase was an average of 25%. Reference:  "Effect on Routine and Special Coagulation Testing Values of Citrate Anticoagulant Adjustment in Patients with High HCT Values." American Journal of Clinical Pathology 2006;126:400-405.)  Routine Hem:  09-Dec-14 08:44   WBC (CBC)  24.5  RBC (CBC)  2.98  Hemoglobin (CBC)  10.6  Hematocrit (CBC)  30.3  Platelet Count (CBC)  135 (Result(s) reported on 14 Sep 2013 at 10:43AM.)  MCV  102  MCH  35.7  MCHC 35.0  RDW  23.6  Bands 1  Segmented Neutrophils 81  Lymphocytes 8  Monocytes 8  Metamyelocyte 1  Myelocyte 1  NRBC 11  Diff Comment 1 ANISOCYTOSIS  Diff Comment 2 POIKILOCYTOSIS  Diff Comment 3 PLTS VARIED IN SIZE  Diff Comment 4 TARGET CELLS  Diff Comment 5 ELLIPTOCYTES  Diff Comment 6 BURR CELLS  Diff Comment 7 POLYCHROMASIA  Result(s) reported on 14 Sep 2013 at 10:43AM.   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  Recommend liver biopsy once bleeding risk declines. Small pancreatic head cyst: FU MRCP to look for IPMN in 6 mo.   Plan 1) FU MRCP in 6 mo 2) continue supportive measures 3) Liver bx once INR down or consider transjugular if available at another facility Please call if you have any questions or concerns   Electronic Signatures for Addendum Section:  Joselyn Arrow (NP) (Signed Addendum 09-Dec-14 14:18)  Spoke w/ Dr Servando Snare & Interventional Radiologist, Dr Claudette Laws, who will attempt to facilitate TJ liver biopsy either in-house or at [facility].   Electronic Signatures: Joselyn Arrow (NP)  (Signed 09-Dec-14 13:57)  Authored: Chief Complaint, VITAL SIGNS/ANCILLARY NOTES, Brief Assessment, Lab Results, Assessment/Plan   Last Updated: 09-Dec-14 14:18 by Joselyn Arrow  (NP)

## 2015-01-27 NOTE — Consult Note (Signed)
Chief Complaint:  Subjective/Chief Complaint Pt denies nausea or vomiting. Eating well.  Denies diarrhea or abdominal pain.  Pruritis improved.   VITAL SIGNS/ANCILLARY NOTES: **Vital Signs.:   19-Dec-14 09:10  Vital Signs Type Q 4hr  Temperature Temperature (F) 97.6  Celsius 36.4  Temperature Source oral  Pulse Pulse 81  Respirations Respirations 18  Systolic BP Systolic BP 124  Diastolic BP (mmHg) Diastolic BP (mmHg) 80  Mean BP 94  Pulse Ox % Pulse Ox % 98  Pulse Ox Activity Level  At rest  Oxygen Delivery Room Air/ 21 %   Brief Assessment:  GEN no acute distress, thin, A/Ox3.  Grandson @bedside .   Cardiac Regular  murmur present   Respiratory normal resp effort   Gastrointestinal details normal Soft  Nontender  Nondistended  Bowel sounds normal  No rebound tenderness  No gaurding   EXTR +clubbing, no edema bilat   Additional Physical Exam Skin: warm, dry intact HEENT: mild scleral icterus. 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   MELD 33 with up to 83% 3 -month mortality.  Overall prognosis poor.  Pt to go to skilled facility once bed available.  Discussed with Dr Nemiah CommanderKalisetti today.  Disucssed with pt's daughter yesterday, Ms. Brooke DareKing. Small pancreatic head cyst: FU MRCP to look for IPMN in 6 mo. Colitis on CT: Persistent.  DC flagyl/cipro at discharge as he has completed treatment.   Plan 1) FU MRCP in 6 mo 2) Prednisolone 40mg  daily until Jan 1, then 20mg  daily x 7days, then 10mg  daily x 7 days, then STOP 3) Await final biopsy results 4) colonoscopy to evaluate ascending colon thickening on CT as outpatient if pt 's overall condition permits 5) Pt will need FU CBC, CMP & check INR at least  weekly 6) If pruritis/diarrhea is a significant issue, may use Questran 4 grams daily (NOT within 2 hrs of any other medications) 7) Will make FU OV with us in 4 weeks Will sign off.  Please call if you have any questions or concerns   Electronic Signatures: Joselyn ArrowJones, Sahej Schrieber L (NP)  (Signed 19-Dec-14 10:38)  Authored: Chief Complaint, VITAL SIGNS/ANCILLARY NOTES, Brief Assessment, Lab Results, Assessment/Plan   Last Updated: 19-Dec-14 10:38 by Joselyn ArrowJones, Leron Stoffers L (NP)

## 2015-01-27 NOTE — Consult Note (Signed)
Chief Complaint:  Subjective/Chief Complaint Pt denies nausea or vomiting. Eating well.  Pt reports couple loose stools.  c/p LUQ intermittent pain, none right now.  Pruritis better.   VITAL SIGNS/ANCILLARY NOTES: **Vital Signs.:   15-Dec-14 05:50  Temperature Temperature (F) 98  Celsius 36.6  Temperature Source oral  Pulse Pulse 63  Respirations Respirations 18  Systolic BP Systolic BP 542  Diastolic BP (mmHg) Diastolic BP (mmHg) 76  Mean BP 90  Pulse Ox % Pulse Ox % 96  Pulse Ox Activity Level  At rest  Oxygen Delivery Room Air/ 21 %   Brief Assessment:  GEN no acute distress, thin, A/Ox3.   Cardiac Regular  murmur present   Respiratory normal resp effort   Gastrointestinal details normal Soft  Nontender  Nondistended  Bowel sounds normal  No rebound tenderness  No gaurding   EXTR +clubbing, no edema bilat   Additional Physical Exam Skin: warm, dry intact HEENT: mild scleral icterus.   Lab Results:  Hepatic:  15-Dec-14 04:58   Bilirubin, Total  9.2  Alkaline Phosphatase  371 (45-117 NOTE: New Reference Range 08/27/13)  SGPT (ALT)  132  SGOT (AST)  204  Total Protein, Serum  5.5  Albumin, Serum  1.1  Routine Chem:  15-Dec-14 04:58   Glucose, Serum  121  BUN  72  Creatinine (comp)  3.04  Sodium, Serum 140  Potassium, Serum  3.1  Chloride, Serum 106  CO2, Serum 26  Calcium (Total), Serum  8.1  Osmolality (calc) 302  eGFR (African American)  24  eGFR (Non-African American)  21 (eGFR values <43m/min/1.73 m2 may be an indication of chronic kidney disease (CKD). Calculated eGFR is useful in patients with stable renal function. The eGFR calculation will not be reliable in acutely ill patients when serum creatinine is changing rapidly. It is not useful in  patients on dialysis. The eGFR calculation may not be applicable to patients at the low and high extremes of body sizes, pregnant women, and vegetarians.)  Result Comment labs - This specimen was collected  through an   - indwelling catheter or arterial line.  - A minimum of 554m of blood was wasted prior    - to collecting the sample.  Interpret  - results with caution.  Result(s) reported on 20 Sep 2013 at 05:27AM.  Anion Gap 8   Radiology Results: CT:    14-Dec-14 13:25, CT Abdomen and Pelvis Without Contrast  CT Abdomen and Pelvis Without Contrast   REASON FOR EXAM:    (1) abdominal pain /diarrhea/follow up of colitis;   (2) diarrhea/colitis  COMMENTS:       PROCEDURE: CT  - CT ABDOMEN AND PELVIS W0  - Sep 19 2013  1:25PM     CLINICAL DATA:  Abdominal pain    EXAM:  CT ABDOMEN AND PELVIS WITHOUTCONTRAST    TECHNIQUE:  Multidetector CT imaging of the abdomen and pelvis was performed  following the standard protocol without intravenous contrast.  COMPARISON:  09/06/2013    FINDINGS:  Lung bases demonstrate bibasilar atelectatic changes left slightly  greater than right. Small pleural effusions are noted bilaterally.    The liver is somewhat shrunken and nodular consistent with  underlying cirrhotic change. A moderate amount of ascites is noted.  The gallbladder is well distended and demonstrates multiple  gallstones stable from the prior exam. The spleen, pancreas and  adrenal glands are within normal limits.    The kidneys are within normal limits without hydronephrotic change.  Diffuse aortic calcifications are seen. Wall thickeningis again  noted in the ascending colon and cecum stable from the prior exam.  The bladder is decompressed by Foley catheter. A central venous line  is noted in the right common femoral vein. Diffuse 3rd spacing of  fluid into the subcutaneous soft tissues is noted as well.     IMPRESSION:  Changes of ascites and anasarca as well as cirrhosis of the liver.    Cholelithiasis without complicating factors.    Stable wall thickening within the ascending colon.    Small bilateral pleural effusions and bibasilar atelectatic  changes.      Electronically Signed    By: Inez Catalina M.D.    On: 09/19/2013 14:44         Verified By: Everlene Farrier, M.D.,   Assessment/Plan:  Assessment/Plan:  Assessment Acute on chronic liver disease with Hepatitis C genotype 1a/ETOH +/-autoimmune component:  LFTs slow to improve.  +thrombocytopenia.  Awaiting liver bx report.   Small pancreatic head cyst: FU MRCP to look for IPMN in 6 mo. Colitis on CT: Persistent.  Pt on empiric flagyl/cipro.  Pt cannot remember last colonoscopy.  Direct visualization via colonoscopy once acute illness resolved.  Will discuss with Dr Allen Norris.  Negative fecal WBCs, stool cx & c diff PCR x2 (last 12/11)   Plan 1) FU MRCP in 6 mo 2) continue supportive measures 3)Await biopsy results 4) colonoscopy to evaluate ascending colon thickening on CT-will discuss timing with Dr Allen Norris Please call if you have any questions or concerns   Electronic Signatures for Addendum Section:  Andria Meuse (NP) (Signed Addendum 15-Dec-14 15:12)  Discussed with Dr Allen Norris.  Colonoscopy once platelets improved, renal function improved & pt's overall status improved in a couple weeks.   Electronic Signatures: Andria Meuse (NP)  (Signed 15-Dec-14 09:32)  Authored: Chief Complaint, VITAL SIGNS/ANCILLARY NOTES, Brief Assessment, Lab Results, Radiology Results, Assessment/Plan   Last Updated: 15-Dec-14 15:12 by Andria Meuse (NP)

## 2015-01-27 NOTE — Consult Note (Signed)
Chief Complaint:  Subjective/Chief Complaint Cirrhosis with decreased liver enzymes and increased ONR and Cr.   VITAL SIGNS/ANCILLARY NOTES: **Vital Signs.:   05-Dec-14 13:32  Vital Signs Type Routine  Temperature Temperature (F) 97.9  Celsius 36.6  Temperature Source oral  Pulse Pulse 68  Respirations Respirations 18  Systolic BP Systolic BP 361  Diastolic BP (mmHg) Diastolic BP (mmHg) 73  Mean BP 86  Pulse Ox % Pulse Ox % 99  Pulse Ox Activity Level  At rest  Oxygen Delivery Room Air/ 21 %   Brief Assessment:  GEN well developed, well nourished, no acute distress, thin   Cardiac Regular   Respiratory normal resp effort  clear BS   Gastrointestinal Normal   Gastrointestinal details normal Soft  Nontender   Additional Physical Exam Alert and orientated times 3   Lab Results: Hepatic:  05-Dec-14 06:49   Bilirubin, Total  17.0  Alkaline Phosphatase  507 (45-117 NOTE: New Reference Range 08/27/13)  SGPT (ALT)  224  SGOT (AST)  321  Total Protein, Serum  6.3  Albumin, Serum  1.3  Routine Chem:  05-Dec-14 06:49   Glucose, Serum  109  BUN  79  Creatinine (comp)  3.70  Sodium, Serum 139  Potassium, Serum 3.5  Chloride, Serum  114  CO2, Serum  15  Calcium (Total), Serum  8.0  Osmolality (calc) 302  eGFR (African American)  19  eGFR (Non-African American)  17 (eGFR values <36m/min/1.73 m2 may be an indication of chronic kidney disease (CKD). Calculated eGFR is useful in patients with stable renal function. The eGFR calculation will not be reliable in acutely ill patients when serum creatinine is changing rapidly. It is not useful in  patients on dialysis. The eGFR calculation may not be applicable to patients at the low and high extremes of body sizes, pregnant women, and vegetarians.)  Result Comment TOTAL BILIRUBIN - RESULTS VERIFIED BY REPEAT TESTING.  - CALLED TO BILL SMITH AT [phone].Marland KitchenMarland KitchenCiales - READ-BACK PROCESS PERFORMED.  Result(s) reported on 10 Sep 2013  at 07:37AM.  Anion Gap 10   Assessment/Plan:  Assessment/Plan:  Assessment Cirrhosis with ascites.   Plan The patient has no complaints.Liver enzymes better with worsening INR and Cr. The patient is being followed by Nephology. Likely acute on chronic liver disease. Acute exposure to a hepatotoxin with his undiagnosied HCV has worsened cirrhosis and ascities. Supportive care recommended. Follow daily INR's.   Electronic Signatures: WLucilla Lame(MD)  (Signed 05-Dec-14 13:45)  Authored: Chief Complaint, VITAL SIGNS/ANCILLARY NOTES, Brief Assessment, Lab Results, Assessment/Plan   Last Updated: 05-Dec-14 13:45 by WLucilla Lame(MD)

## 2015-01-27 NOTE — Discharge Summary (Signed)
PATIENT NAME:  Tommy Simpson, Tommy Simpson MR#:  [id number] DATE OF BIRTH:  [birth year]  DATE OF ADMISSION:  09/06/2013 DATE OF DISCHARGE:  09/24/2013  Please see interim discharge summary dictated by Dr. Auburn BilberryShreyang Patel on 12th of December. Please also refer to interim discharge summary dictated by Dr. Nemiah CommanderKalisetti on 20th of December.   FINAL DISCHARGE DIAGNOSES:  1.  Acute on chronic liver failure due to acute on chronic liver disease with underlying hepatitis C. 2.  Ascites with generalized anasarca.  3.  Ascites status post removal of 2.7 L of fluid successfully under ultrasound guidance today. 4.  Ascending colitis, improving, outpatient colonoscopy by gastroenterology.  5.  Acute renal failure requiring dialysis.  6.  Acute on chronic thrombocytopenia.  7.  History of coronary artery disease.  8.  Severe malnutrition. 9.  Hypoalbuminemia.   SECONDARY DIAGNOSES:  As dictated in the history and physical and 2 interim summaries.   CONSULTATIONS:  As reported in the interim discharge summary dictated by Dr. Nemiah CommanderKalisetti on the 20th of December and Dr. Auburn BilberryShreyang Patel on the 12th of December. No new consultations were obtained.   PROCEDURES AND RADIOLOGY:  As dictated in the interim discharge summary by Dr. Nemiah CommanderKalisetti on the 20th of December and by Dr. Auburn BilberryShreyang Patel on the 12th of December. The patient had an ultrasound-guided paracentesis done on the 26th of December and removal of 2.7 L of yellow fluid, clear, without any immediate complication.   HISTORY AND SHORT HOSPITAL COURSE:  The patient is a 64 year old male with the above-mentioned medical problems who was admitted for malaise and weakness. Please see Dr. Ardine EngSanchez's dictated history and physical on 1st of December for further details. Please see Dr. Serita GritShreyang Patel's dictated interim discharge on the 12th of December for course from admission to December 12th. Please see Dr. Thomasena Edisadhika Kalisetti's dictated interim discharge summary on 12th of December for  course from 13th of December until the 20th of December. Please note, I rounded on this patient from 24th of December until the 26th of December. The patient was waiting to get a bed at [facility] where he has obtained a bed for dialysis need and is being discharged there. The patient was getting more distended abdomen and obvious ascites for which ultrasound-guided paracentesis was performed today without any immediate complication. The patient was feeling much better. The patient did not get any dialysis for the last 2 days and further dialysis needs will be decided per nephrology. His platelet counts are slowly dropping but have been stable, not requiring any blood transfusion. The patient has been feeling okay except more anasarca and is being [facility] with Lasix drip as recommended by nephrology, which has been ordered.   DISCHARGE MEDICATIONS:  1.  Metoprolol 25 mg p.o. b.i.d.  2.  Sodium bicarbonate 650 mg p.o. b.i.d.  3.  Cholestyramine 4 grams p.o. daily.  4.  Ergocalciferol 50,000 international units once a week orally.  5.  Protonix 40 mg p.o. daily. 6.  Temazepam 7.5 mg p.o. at bedtime as needed.  7.  Prednisone 40 mg p.o. daily for 7 days, taper 10 mg daily until finished.  8.  Oxycodone 2.5 mg p.o. every 6 hours as needed.  9.  Lasix 10 mL/hr drip continuous as per nephrology.   DISCHARGE DIET:  Renal diet.   DISCHARGE ACTIVITY:  As tolerated.   DISCHARGE INSTRUCTIONS AND FOLLOWUP:  1.  The patient is being transferred to [facility]. He  will need followup by the physician there immediately after transfer along with nephrology consult for dialysis needs.  2.  He will need weekly liver function tests for close monitoring and GI followup in 2 weeks.   TOTAL TIME DISCHARGING THIS PATIENT: 55 minutes.   ____________________________ Tommy Sia. Sherryll Burger, MD vss:jm D: 2013-10-02 16:16:00 ET T: Oct 02, 2013  16:38:37 ET JOB#: [id number]  cc: [facility] Mosetta Pigeon, MD Midge Minium, MD Annice Needy, MD Leshay Desaulniers S. Sherryll Burger, MD, <Dictator>   Tommy Sia [facility] MD ELECTRONICALLY SIGNED 10/03/2013 10:27

## 2015-01-27 NOTE — Consult Note (Signed)
Chief Complaint:  Subjective/Chief Complaint Patient with continued increased LFT's with a history of HCV and now with worsening renal function. The patients INR had been 1.6. The SMA was positive and his IgG was increased. The ANA was negative.   VITAL SIGNS/ANCILLARY NOTES: **Vital Signs.:   07-Dec-14 10:10  Vital Signs Type Q 4hr  Temperature Temperature (F) 98  Celsius 36.6  Temperature Source oral  Pulse Pulse 72  Respirations Respirations 19  Systolic BP Systolic BP 361  Diastolic BP (mmHg) Diastolic BP (mmHg) 78  Mean BP 94  Pulse Ox % Pulse Ox % 99  Pulse Ox Activity Level  At rest  Oxygen Delivery Room Air/ 21 %   Brief Assessment:  GEN well developed, well nourished, no acute distress   Respiratory normal resp effort  no use of accessory muscles   Gastrointestinal details normal Soft  Nontender   Lab Results: Hepatic:  07-Dec-14 05:04   Bilirubin, Total  14.7  Bilirubin, Direct  11.50 (Result(s) reported on 12 Sep 2013 at 05:50AM.)  Alkaline Phosphatase  420 (45-117 NOTE: New Reference Range 08/27/13)  SGPT (ALT)  185  SGOT (AST)  203  Total Protein, Serum 6.5  Albumin, Serum  1.3  Routine Chem:  07-Dec-14 05:04   BUN  108  Creatinine (comp)  5.71  Sodium, Serum 137  Potassium, Serum 3.8  Chloride, Serum  113  CO2, Serum  11  Calcium (Total), Serum  8.0  Anion Gap 13  Osmolality (calc) 309  eGFR (African American)  11  eGFR (Non-African American)  10 (eGFR values <44m/min/1.73 m2 may be an indication of chronic kidney disease (CKD). Calculated eGFR is useful in patients with stable renal function. The eGFR calculation will not be reliable in acutely ill patients when serum creatinine is changing rapidly. It is not useful in  patients on dialysis. The eGFR calculation may not be applicable to patients at the low and high extremes of body sizes, pregnant women, and vegetarians.)  Result Comment PLATELET - SLIGHT PLATELET CLUMPING IN SPECIMEN.  ACTUAL  - NUMERICAL COUNT MAY BE SOMEWHAT HIGHER THAN  - THE REPORTED VALUE.  Result(s) reported on 12 Sep 2013 at 06:51AM.  Routine Coag:  07-Dec-14 05:04   Prothrombin  18.8  INR 1.6 (INR reference interval applies to patients on anticoagulant therapy. A single INR therapeutic range for coumarins is not optimal for all indications; however, the suggested range for most indications is 2.0 - 3.0. Exceptions to the INR Reference Range may include: Prosthetic heart valves, acute myocardial infarction, prevention of myocardial infarction, and combinations of aspirin and anticoagulant. The need for a higher or lower target INR must be assessed individually. Reference: The Pharmacology and Management of the Vitamin K  antagonists: the seventh ACCP Conference on Antithrombotic and Thrombolytic Therapy. CWERXV.4008Sept:126 (3suppl): [id number] A HCT value >55% may artifactually increase the PT.  In one study,  the increase was an average of 25%. Reference:  "Effect on Routine and Special Coagulation Testing Values of Citrate Anticoagulant Adjustment in Patients with High HCT Values." American Journal of Clinical Pathology 2006;126:400-405.)  Routine Hem:  07-Dec-14 05:04   Platelet Count (CBC) 176   Assessment/Plan:  Assessment/Plan:  Assessment Acute hepatitis with increased Cr.   Plan The patients increased LFT's have not been improving as expected. The HCV is likely the chronic cause but the acute on chronic reason for decompensation is unknown. At this point a liver biospy should be considered. His INR is 1.6 and I am  not sure if radiology would do it with this INR. Will recheck LFT's and INR tomorrow.   Electronic Signatures: Lucilla Lame (MD)  (Signed 07-Dec-14 10:23)  Authored: Chief Complaint, VITAL SIGNS/ANCILLARY NOTES, Brief Assessment, Lab Results, Assessment/Plan   Last Updated: 07-Dec-14 10:23 by Lucilla Lame (MD)

## 2015-01-27 NOTE — Consult Note (Signed)
Chief Complaint:  Subjective/Chief Complaint Pt denies nausea or vomiting. Eating well.  Denies diarrhea or abdominal pain.   VITAL SIGNS/ANCILLARY NOTES: **Vital Signs.:   16-Dec-14 08:17  Temperature Temperature (F) 97.6  Celsius 36.4  Temperature Source oral  Pulse Pulse 61  Respirations Respirations 20  Systolic BP Systolic BP 497  Diastolic BP (mmHg) Diastolic BP (mmHg) 80  Mean BP 94  Pulse Ox % Pulse Ox % 97  Pulse Ox Activity Level  At rest  Oxygen Delivery Room Air/ 21 %   Brief Assessment:  GEN no acute distress, thin, A/Ox3.   Cardiac Regular  murmur present   Respiratory normal resp effort   Gastrointestinal details normal Soft  Nontender  Nondistended  Bowel sounds normal  No rebound tenderness  No gaurding   EXTR +clubbing, no edema bilat   Additional Physical Exam Skin: warm, dry intact HEENT: mild scleral icterus. GU: Foley with 160cc dark tea colored urine   Lab Results:  Hepatic:  15-Dec-14 04:58   Bilirubin, Total  9.2  Alkaline Phosphatase  371 (45-117 NOTE: New Reference Range 08/27/13)  SGPT (ALT)  132  SGOT (AST)  204  Total Protein, Serum  5.5  Albumin, Serum  1.1  Routine Chem:  15-Dec-14 04:58   Glucose, Serum  121  BUN  72  Creatinine (comp)  3.04  Sodium, Serum 140  Potassium, Serum  3.1  Chloride, Serum 106  CO2, Serum 26  Calcium (Total), Serum  8.1  Osmolality (calc) 302  eGFR (African American)  24  eGFR (Non-African American)  21 (eGFR values <65m/min/1.73 m2 may be an indication of chronic kidney disease (CKD). Calculated eGFR is useful in patients with stable renal function. The eGFR calculation will not be reliable in acutely ill patients when serum creatinine is changing rapidly. It is not useful in  patients on dialysis. The eGFR calculation may not be applicable to patients at the low and high extremes of body sizes, pregnant women, and vegetarians.)  Result Comment labs - This specimen was collected through an    - indwelling catheter or arterial line.  - A minimum of 518m of blood was wasted prior    - to collecting the sample.  Interpret  - results with caution.  Result(s) reported on 20 Sep 2013 at 05:27AM.  Anion Gap 8   Assessment/Plan:  Assessment/Plan:  Assessment Acute on chronic liver disease with Hepatitis C genotype 1a/ETOH +/-autoimmune component:  LFTs slowly improving.  Spoke with Pathology today, Biopsy not yet available.   Small pancreatic head cyst: FU MRCP to look for IPMN in 6 mo. Colitis on CT: Persistent.  Pt on empiric flagyl/cipro.  Colonoscopy once acute illness resolved.  Negative fecal WBCs, stool cx & c diff PCR x2 (last 12/11)   Plan 1) FU MRCP in 6 mo 2) continue supportive measures 3)Await biopsy results 4) colonoscopy to evaluate ascending colon thickening on CT once acute issues resolved 5) FU CBC, CMP & check INR in AM Please call if you have any questions or concerns   Electronic Signatures: JoAndria MeuseNP)  (Signed 16-Dec-14 11:34)  Authored: Chief Complaint, VITAL SIGNS/ANCILLARY NOTES, Brief Assessment, Lab Results, Assessment/Plan   Last Updated: 16-Dec-14 11:34 by JoAndria MeuseNP)

## 2015-01-27 NOTE — Consult Note (Signed)
Chief Complaint:  Subjective/Chief Complaint Pt denies abdominal pain, nausea, or vomiting.  Feels some better but continued malaise.   VITAL SIGNS/ANCILLARY NOTES: **Vital Signs.:   03-Dec-14 09:54  Vital Signs Type Q 4hr  Temperature Temperature (F) 98.3  Celsius 36.8  Temperature Source oral  Pulse Pulse 75  Respirations Respirations 18  Systolic BP Systolic BP 415  Diastolic BP (mmHg) Diastolic BP (mmHg) 79  Mean BP 96  Pulse Ox % Pulse Ox % 100  Pulse Ox Activity Level  At rest  Oxygen Delivery Room Air/ 21 %   Brief Assessment:  GEN no acute distress, thin, A/Ox3.   Cardiac Regular  murmur present   Respiratory normal resp effort   Gastrointestinal details normal Soft  Nontender  Nondistended  Bowel sounds normal   EXTR +clubbing, no edema bilat   Additional Physical Exam Skin: +jaundice HEENT: scleral icterus.   Lab Results: Hepatic:  03-Dec-14 06:49   Bilirubin, Total  19.2  Alkaline Phosphatase  598 (45-117 NOTE: New Reference Range 08/27/13)  SGPT (ALT)  262  SGOT (AST)  450  Total Protein, Serum  5.9  Albumin, Serum  1.3  Routine Chem:  03-Dec-14 06:49   Glucose, Serum  118  BUN  60  Creatinine (comp)  2.55  Sodium, Serum 140  Potassium, Serum  3.0  Chloride, Serum  114  CO2, Serum  12  Calcium (Total), Serum  8.3  Osmolality (calc) 297  eGFR (African American)  30  eGFR (Non-African American)  26 (eGFR values <79m/min/1.73 m2 may be an indication of chronic kidney disease (CKD). Calculated eGFR is useful in patients with stable renal function. The eGFR calculation will not be reliable in acutely ill patients when serum creatinine is changing rapidly. It is not useful in  patients on dialysis. The eGFR calculation may not be applicable to patients at the low and high extremes of body sizes, pregnant women, and vegetarians.)  Anion Gap 14  Routine Hem:  03-Dec-14 06:49   WBC (CBC)  14.1  RBC (CBC)  3.03  Hemoglobin (CBC)  11.0   Hematocrit (CBC)  31.7  Platelet Count (CBC) 177 (Result(s) reported on 08 Sep 2013 at 07:23AM.)  MCV  105  MCH  36.2  MCHC 34.6  RDW  19.0  Segmented Neutrophils 83  Lymphocytes 11  Monocytes 6  Diff Comment 1 ANISOCYTOSIS  Diff Comment 2 POIKILOCYTOSIS  Diff Comment 3 TARGET CELLS  Diff Comment 4 PLTS VARIED IN SIZE  Diff Comment 5 TOXIC GRANULATION  Result(s) reported on 08 Sep 2013 at 07:23AM.   Assessment/Plan:  Assessment/Plan:  Assessment Painless jaundice/Hepatitis/Acute on chronic cirrhosis:  LFTs improved minimally.  Hepatitis C antibody positive, awaiting RNA/PCR.  Hx ETOH abuse as well. Condition guarded.  MRCP results pending to look for stricture, occult mass, or stone.  Awaiting hemochromatosis labs, AMA, ANA, Alpha 1-antitrypsin.  Care has been discussed with Dr WAllen Norris  CMV, EBV remain in differential as well.  Right-sided Segmental colitis:  Infectious vs.IBD vs. ischemia.  Await stool studies. C diff negative.  On zosyn & flagyl with adequate empiric coverage. Abnormal TSH: Per attending. Hypokalemia: Improving. Per attending   Plan 1) FU stool studies 2) FU MRCP  3) FU HCV quant RNA by PCR with reflex genotype , ANA, AMA, DNA for hemochromatosis, A1AT 4) K repletion per attending 5) thyroid management per attending Please call if you have any questions or concerns   Electronic Signatures: JAndria Meuse(NP)  (Signed 03-Dec-14 13:45)  Authored: Chief Complaint, VITAL SIGNS/ANCILLARY NOTES, Brief Assessment, Lab Results, Assessment/Plan   Last Updated: 03-Dec-14 13:45 by Andria Meuse (NP)

## 2015-01-27 NOTE — Consult Note (Signed)
Chief Complaint:  Subjective/Chief Complaint Pt denies abdominal pain, nausea, or vomiting. Eating well.   VITAL SIGNS/ANCILLARY NOTES: **Vital Signs.:   11-Dec-14 10:55  Vital Signs Type Post-Procedure  Temperature Temperature (F) 97.4  Celsius 36.3  Temperature Source oral  Pulse Pulse 68  Respirations Respirations 16  Systolic BP Systolic BP 400  Diastolic BP (mmHg) Diastolic BP (mmHg) 67  Mean BP 78  Pulse Ox % Pulse Ox % 95  Pulse Ox Activity Level  At rest  Oxygen Delivery Room Air/ 21 %   Brief Assessment:  GEN no acute distress, thin, A/Ox3.   Cardiac Regular  murmur present   Respiratory normal resp effort   Gastrointestinal details normal Soft  Nontender  Nondistended  Bowel sounds normal   EXTR +clubbing, no edema bilat   Additional Physical Exam Skin: warm, dry intact HEENT: mild scleral icterus.   Lab Results: Hepatic:  11-Dec-14 13:00   Bilirubin, Total  10.8  Alkaline Phosphatase  339 (45-117 NOTE: New Reference Range 08/27/13)  SGPT (ALT)  131  SGOT (AST)  198  Total Protein, Serum  5.4  Albumin, Serum  1.1  Routine Chem:  11-Dec-14 13:00   Glucose, Serum  133  BUN  47  Creatinine (comp)  2.84  Sodium, Serum 140  Potassium, Serum  2.8  Chloride, Serum 104  CO2, Serum 30  Calcium (Total), Serum  7.9  Osmolality (calc) 294  eGFR (African American)  26  eGFR (Non-African American)  23 (eGFR values <6m/min/1.73 m2 may be an indication of chronic kidney disease (CKD). Calculated eGFR is useful in patients with stable renal function. The eGFR calculation will not be reliable in acutely ill patients when serum creatinine is changing rapidly. It is not useful in  patients on dialysis. The eGFR calculation may not be applicable to patients at the low and high extremes of body sizes, pregnant women, and vegetarians.)  Anion Gap  6  Routine UA:  11-Dec-14 13:00   Color (UA) Amber  Clarity (UA) Clear  Glucose (UA) Negative  Bilirubin (UA)  2+  Ketones (UA) Negative  Specific Gravity (UA) 1.014  Blood (UA) 2+  pH (UA) 7.0  Protein (UA) 30 mg/dL  Nitrite (UA) Negative  Leukocyte Esterase (UA) Trace (Result(s) reported on 16 Sep 2013 at 01:30PM.)  RBC (UA) 42 /HPF  WBC (UA) 11 /HPF  Bacteria (UA) NONE SEEN  Epithelial Cells (UA) 1 /HPF  Transitional Epithelial (UA) 1 /HPF  Hyaline Cast (UA) 3 /LPF (Result(s) reported on 16 Sep 2013 at 01:30PM.)  Routine Coag:  11-Dec-14 13:00   Prothrombin  20.0  INR 1.7 (INR reference interval applies to patients on anticoagulant therapy. A single INR therapeutic range for coumarins is not optimal for all indications; however, the suggested range for most indications is 2.0 - 3.0. Exceptions to the INR Reference Range may include: Prosthetic heart valves, acute myocardial infarction, prevention of myocardial infarction, and combinations of aspirin and anticoagulant. The need for a higher or lower target INR must be assessed individually. Reference: The Pharmacology and Management of the Vitamin K  antagonists: the seventh ACCP Conference on Antithrombotic and Thrombolytic Therapy. CQQPYP.9509Sept:126 (3suppl): [id number] A HCT value >55% may artifactually increase the PT.  In one study,  the increase was an average of 25%. Reference:  "Effect on Routine and Special Coagulation Testing Values of Citrate Anticoagulant Adjustment in Patients with High HCT Values." American Journal of Clinical Pathology 23267;124:580-998)  Routine Hem:  11-Dec-14 13:00   RBC (CBC)  2.91  Hemoglobin (CBC)  10.2  Hematocrit (CBC)  29.7  Platelet Count (CBC)  89 (Result(s) reported on 16 Sep 2013 at 01:37PM.)  MCV  102  MCH  35.2  MCHC 34.5  RDW  23.6   Assessment/Plan:  Assessment/Plan:  Assessment Acute on chronic liver disease with chronic Hepatitis C/ETOH +/-autoimmune component:  LFTs slow to improve.  TJ liver Bx by IR this AM.  Await report.  MELD 31.   Small pancreatic head cyst: FU MRCP  to look for IPMN in 6 mo.   Plan 1) FU MRCP in 6 mo 2) continue supportive measures 3)Await biopsy Please call if you have any questions or concerns   Electronic Signatures: Andria Meuse (NP)  (Signed 11-Dec-14 14:12)  Authored: Chief Complaint, VITAL SIGNS/ANCILLARY NOTES, Brief Assessment, Lab Results, Assessment/Plan   Last Updated: 11-Dec-14 14:12 by Andria Meuse (NP)

## 2015-01-27 NOTE — Consult Note (Signed)
Brief Consult Note: Diagnosis: jaundice.   Patient was seen by consultant.   Consult note dictated.   Discussed with Attending MD.   Comments: [name] is a pleasant 64 y/o black male admitted with profound painless jaundice.  Bilirubin elevated in obstructive pattern likely hepatocellular with significant transaminitis as well.  Pt has malaise, pruritis, dark urine & clay stools for couple weeks, worsening over past few days.  Denies any recent ETOH although he does report an occasional beer.  Hx intranasal drug use.  Denies IV drug use.   Ultrasound show fatty liver without mass.  No CBD dilation.  Hx prostate cancer s/p radiation tx in June 2014 felt to be in remission.  He denies any new medications or significant acetaminophen use.  Differentials include malignancy, "closet" ETOH use, viral hepatitis, or less likely other less common liver diseases.  MELD 34, condition guarded.  Plan: 1) Agree with CT  2) Agree with acetaminophen, serum ETOH, ferritin, acute hepatitis panels, HIV, PSA & UDS 3) Consider AMA, ANA, cerulosplasmin, immunoglobulins, anti-SMA pending above results. 4) K repletion per attending  Thanks for consult.  Please see full dictated note. #[id number].  Electronic Signatures: Tommy Simpson, Tommy Simpson (NP)  (Signed 01-Dec-14 20:57)  Authored: Brief Consult Note   Last Updated: 01-Dec-14 20:57 by Tommy Simpson, Tommy Simpson (NP)

## 2015-01-27 NOTE — Consult Note (Signed)
Chief Complaint:  Subjective/Chief Complaint Pt denies nausea or vomiting. Eating well.  Denies diarrhea or abdominal pain.   VITAL SIGNS/ANCILLARY NOTES: **Vital Signs.:   17-Dec-14 10:15  Vital Signs Type Q 4hr  Temperature Temperature (F) 97.5  Celsius 36.3  Temperature Source oral  Respirations Respirations 18  Systolic BP Systolic BP 176  Diastolic BP (mmHg) Diastolic BP (mmHg) 67  Mean BP 80  Pulse Ox % Pulse Ox % 98  Pulse Ox Activity Level  At rest  Oxygen Delivery Room Air/ 21 %   Brief Assessment:  GEN no acute distress, thin, A/Ox3.  Wife _0 .   Cardiac Regular  murmur present   Respiratory normal resp effort   Gastrointestinal details normal Soft  Nontender  Nondistended  Bowel sounds normal  No rebound tenderness  No gaurding   EXTR +clubbing, no edema bilat   Additional Physical Exam Skin: warm, dry intact HEENT: mild scleral icterus. GU: Foley intact w/ tea colred urine   Lab Results: Hepatic:  17-Dec-14 04:05   Bilirubin, Total  9.7  Alkaline Phosphatase  448 (45-117 NOTE: New Reference Range 08/27/13)  SGPT (ALT)  165  SGOT (AST)  270  Total Protein, Serum  5.5  Albumin, Serum  1.1  Routine Chem:  17-Dec-14 04:05   Glucose, Serum  125  BUN  70  Creatinine (comp)  3.04  Sodium, Serum 140  Potassium, Serum 3.7  Chloride, Serum 105  CO2, Serum 27  Calcium (Total), Serum  8.2  Osmolality (calc) 301  eGFR (African American)  24  eGFR (Non-African American)  21 (eGFR values <45m/min/1.73 m2 may be an indication of chronic kidney disease (CKD). Calculated eGFR is useful in patients with stable renal function. The eGFR calculation will not be reliable in acutely ill patients when serum creatinine is changing rapidly. It is not useful in  patients on dialysis. The eGFR calculation may not be applicable to patients at the low and high extremes of body sizes, pregnant women, and vegetarians.)  Anion Gap 8  Routine Coag:  17-Dec-14 04:05    Prothrombin  21.9  INR 1.9 (INR reference interval applies to patients on anticoagulant therapy. A single INR therapeutic range for coumarins is not optimal for all indications; however, the suggested range for most indications is 2.0 - 3.0. Exceptions to the INR Reference Range may include: Prosthetic heart valves, acute myocardial infarction, prevention of myocardial infarction, and combinations of aspirin and anticoagulant. The need for a higher or lower target INR must be assessed individually. Reference: The Pharmacology and Management of the Vitamin K  antagonists: the seventh ACCP Conference on Antithrombotic and Thrombolytic Therapy. CHYWVP.7106Sept:126 (3suppl): [id number] A HCT value >55% may artifactually increase the PT.  In one study,  the increase was an average of 25%. Reference:  "Effect on Routine and Special Coagulation Testing Values of Citrate Anticoagulant Adjustment in Patients with High HCT Values." American Journal of Clinical Pathology 2006;126:400-405.)   Assessment/Plan:  Assessment/Plan:  Assessment Acute on chronic liver disease with Hepatitis C genotype 1a/ETOH +/-autoimmune component:  Discussed with Dr RReuel Derby Pathologist yesterday.  She is continuing to review slides to look for autoimmune, but likely chronic hep C +/- ETOH with significant fibrosis/cirrhosis.  Report should be available soon.  Discussed with Dr SCandiss Norse Small pancreatic head cyst: FU MRCP to look for IPMN in 6 mo. Colitis on CT: Persistent.  Pt on empiric flagyl/cipro.  Colonoscopy once acute illness resolved.  Negative fecal WBCs, stool cx & c diff PCR x2 (  last 12/11)   Plan 1) FU MRCP in 6 mo 2) Prednisolone 23m daily for 30 days total, then 267mdaily x 7days, then 1043maily x 7 days, then STOP 3) Await biopsy results 4) colonoscopy to evaluate ascending colon thickening on CT as outpatient 5) Pt will need FU CBC, CMP & check INR at least weekly 6) If pruritis is a significant  issue, may use Questran 4 grams daily (NOT within 2 hrs of any other medications) 7) Will make FU OV with us [place] 4 weeks Please call if you have any questions or concerns   Electronic Signatures: JonAndria MeuseP)  (Signed 17-Dec-14 12:26)  Authored: Chief Complaint, VITAL SIGNS/ANCILLARY NOTES, Brief Assessment, Lab Results, Assessment/Plan   Last Updated: 17-Dec-14 12:26 by JonAndria MeuseP)

## 2015-01-27 NOTE — Op Note (Signed)
PATIENT NAME:  [name], Tommy MR#:  [id number] DATE OF BIRTH:  1951-06-18  DATE OF PROCEDURE:  09/13/2013  PREOPERATIVE DIAGNOSES:  1.  Acute on chronic renal failure.  2.  Hypertension.  3.  Alcohol abuse.  4.  History of stroke.  POSTOPERATIVE DIAGNOSES: 1.  Acute on chronic renal failure.  2.  Hypertension.  3.  Alcohol abuse.  4.  History of stroke.  PROCEDURE:  1.  Ultrasound guidance for vascular access, right femoral vein.  2.  Placement of right femoral Trialysis-type dialysis catheter, 30 cm in length.   SURGEON: Annice NeedyJason S Pallavi Clifton, M.D.   ANESTHESIA: Local.   ESTIMATED BLOOD LOSS: Minimal.   INDICATION FOR PROCEDURE: A 64 year old [place] male with chronic kidney disease. He has been admitted to the hospital for a week. He has had steadily progressing worsening renal failure and now has a BUN in excess of 100 and needs dialysis. A temporary catheter will be placed.   DESCRIPTION OF PROCEDURE: The patient is laid flat in the floor bed. His right groin was sterilely prepped and draped and a sterile surgical field was created. The right femoral vein was visualized on ultrasound and found to be patent. It was then accessed under direct ultrasound guidance without difficulty with Seldinger needle. J-wire was then placed. After skin nick and dilatation, the 30 cm long Trialysis-type dialysis catheter was placed over the wire and the wire was removed. All three lumens withdrew non-pulsatile dark red blood and flushed easily with sterile saline. It was secured at the skin at 30 cm with 3 nylon sutures. A sterile dressing was placed. The patient tolerated the procedure well   ____________________________ Annice NeedyJason S. Demetrios Byron, MD jsd:aw D: 09/13/2013 11:25:15 ET T: 09/13/2013 11:47:46 ET JOB#: [id number]  cc: Annice NeedyJason S. Ceil Roderick, MD, <Dictator> Annice NeedyJASON S Janaiya Beauchesne MD ELECTRONICALLY SIGNED 09/15/2013 9:48

## 2015-01-27 NOTE — H&P (Signed)
PATIENT NAME:  Tommy GuarneriBARBOUR, Dawayne MR#:  [id number] DATE OF BIRTH:  September 21, 1951  DATE OF ADMISSION:  09/06/2013  REFERRING PHYSICIAN: Dr. Daryel NovemberJonathan Williams.   PRIMARY CARE PHYSICIAN: Nonlocal.   CHIEF COMPLAINT: Malaise.   HISTORY OF PRESENT ILLNESS: This is a very nice 64 year old gentleman with history of hypertension, previous CVA, prostate cancer and treatment with radiation therapy, hyperlipidemia, coronary artery disease, status post CABG, who comes with the chief complaint mostly of not feeling well. Apparently the patient has been feeling well for over 2 weeks, or week and a half, significant malaise, his skin getting dry, itchy all over his body, complaining of low blood pressures at home with orthostatic symptoms, getting really dizzy and lightheaded whenever he stands up, and with lack of energy. The patient is starting to have some edema of the lower extremities, then noticed that they were getting worse. Once he was evaluated here in the ER, he was looking very icteric or jaundiced on his eyes, and his wife said that she did not notice that ever and he did notice that ever. He thought that it was normal color for his eyes. 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 The patient is a very poor historian. I tried to get more information out of him, but he is very difficult to talk to as far as very vague symptomatology. Apparently the patient had weight loss of 1 to 2 pounds over a month. He has not had any fever. He has multiple signs of jaundice including acholia, constipation, Coca-Cola-like urine, and severe itching. The patient is admitted for evaluation of acute liver failure. The patient has a history of being a drinker in the past, but his last drink was  around 3 years ago. That is what he states, but when he is asked a little bit more deeply, he actually has been drinking beer. He states that his last drink of pure alcohol was about 3 years ago, but he  occasionally drinks beer. No more than a couple whenever he drinks, and it is not every day. He used to do cocaine, but it has been over 4 years since he used it after his stroke. The patient is admitted for evaluation and treatment of this condition.   REVIEW OF SYSTEMS:  A 12-system review of systems is done. CONSTITUTIONAL: No fever. Positive fatigue. Positive weakness. Positive weight loss of 5 pounds in 2 months.  EYES:  The patient states that he has occasional double vision, but not all the time, and blurry vision, but he is vague on the symptomatology. No glaucoma or cataracts.  EARS, NOSE, THROAT: No difficulty swallowing. No tinnitus. No epistaxis. No bleeding through the mouth or gums.  RESPIRATORY: The patient has dyspnea with exertion. He cannot exercise much, walk much. He had a stroke, and he has left body paraparesis and weakness, but he gets short of breath with minimal activity and that is chronic. No hemoptysis. The patient is a smoker. Smoking cessation counseling given to the patient for over 5 minutes, and he states that he might try to quit smoking. No previous diagnosis of COPD.  CARDIOVASCULAR: No chest pain, orthopnea. Positive edema. Positive orthostasis. Negative palpitations.  GASTROINTESTINAL: Occasional nausea but no vomiting. The patient denies any abdominal pain at this moment, but he said that a week ago he had some abdominal pain in the epigastric area, but it went away. No hematemesis. No melena. No ulcers. Positive occasional reflux. No IBS.  Positive jaundice, but he did not notice up until today when he was pointed out. No rectal bleeding. Positive occasional constipation. Positive hemorrhoids in the past. No hernia. Positive acholia/white clay-looking stool, constipation, diarrhea on and off. Last BM was this morning, and he is itchy all over.  GENITOURINARY: No dysuria, hematuria, changes in frequency. He is in acute kidney injury, but he says that he is urinating his  normal amount. No prostatitis. Positive for prostate cancer.  ENDOCRINE: No polyuria, polydipsia, polyphagia, cold or heat intolerance.  HEMATOLOGIC AND LYMPHATIC: No anemia, easy bruising or bleeding.  MUSCULOSKELETAL: No neck pain, back pain or arthritis.  NEUROLOGIC: No numbness, tingling. Positive CVA in the past, left side paraparesis. No ataxia. No dementia. No dysarthria. No difficulty swallowing. No seizures.  PSYCHIATRIC: No significant depression or anxiety.   PAST MEDICAL HISTORY: 1.  Hypertension.  2.  Hyperlipidemia.  3.  Prostate cancer.  4.  Coronary artery disease.  5.  Cerebrovascular accident.   ALLERGIES: No known drug allergies.   PAST SURGICAL HISTORY:  1.  Coronary artery bypass graft 2 years ago, 2 arteries.  2.  Hemorrhoidectomy as a young person.   FAMILY HISTORY: No liver problems. No cirrhosis. His father had prostate cancer. No MIs in the family.  SOCIAL HISTORY: Tobacco: He smokes 5 to 6 cigarettes a day, and he has smoked since he was a teenager. Smoking cessation counseling given to the patient, but he states that he is not ready to quit smoking. Alcohol: Again, last drink of pure alcohol at least 3 years ago, but he drinks beer occasionally. He does not consider that alcohol. Cocaine: Last time he used crack cocaine was 4 years ago. The patient denies any history of transfusions. The patient denies any history of IV drug abuse.   MEDICATIONS: Aspirin 81 mg daily, pravastatin 40 mg daily (which we are going to stop due to liver failure), niacin 250 mg daily (also stopped), metoprolol 25 mg 2 times a day (stopped due to hypotension), lisinopril 10 mg once daily (stopped due to acute kidney failure), hydrochlorothiazide 25 mg once a day (stopped due to acute kidney failure), aspirin 81 mg daily.   PHYSICAL EXAMINATION: VITAL SIGNS: Blood pressure of 80/53. Right now his blood pressure is in the 140s after IV fluid resuscitation. Pulse 90, respirations 20,  temperature 97.5, pulse oximetry 100% on room air.  GENERAL: The patient is alert, oriented x 3. No acute distress. No respiratory distress. He looks disheveled. and he looks chronically ill. Her. His pupils are equal and reactive. Extraocular movements are intact. Jaundice, severe on sclerae and mucosae. No oral lesions. No oropharyngeal exudates. Poor dentition.  NECK: Supple. No JVD. No thyromegaly. No adenopathy. No carotid bruits. No rigidity.  CARDIOVASCULAR: Regular rate and rhythm. Systolic ejection murmur, II/VI to III/VI; the patient was not aware of having a murmur. Positive displacement of PMI to anterior axillary line. No tenderness to palpation of anterior chest wall.  LUNGS: Mostly clear, but with decreased respiratory sounds in bases. No use of accessory muscles.  ABDOMEN: Distended, but not tender to palpation, not tender to liver percussion. His liver does not feel enlarged. Did not feel any hepatosplenomegaly. No rebound. No guarding. No vascular spiders or telangiectasias.  GENITAL: Negative for external lesions.  EXTREMITIES: Positive edema, +2. No cyanosis or clubbing.  VASCULAR: Pulses +2. Capillary refill less than 3.  MUSCULOSKELETAL: No significant joint effusions or joint deformity.  NEUROLOGIC: Cranial nerves II through XII intact. Positive weakness of  the left side of his body. Left arm and left leg are 3/5 strength, right arm and leg 5/5 strength. His cerebellar tests are intact, except we did not check his ambulation. We could not check his Romberg either because of weakness.  SKIN: The patient has multiple excoriations of the skin because he has been itching and scratching. There is no bleeding of the skin. There are no open ulcers. Unable to evaluate jaundice because of color of the skin, but the patient definitely has icteric jaundice.  LYMPHATIC: Negative for lymphadenopathy in the neck or supraclavicular areas.  PSYCHIATRIC: No agitation. The patient is alert,  oriented x 3, and very pleasant.   LABORATORY, DIAGNOSTIC AND RADIOLOGICAL DATA: Glucose 114. Iron 83. BUN 81, creatinine 3.39 (unknown baseline). Sodium is 136, potassium  2.4, chloride 106 CO2 of 20, GFR 18. Iron-binding capacity/TIBC 182 (low), iron saturation 46. Ferritin 2300. LDH is elevated at 402. Magnesium 2.4, phosphorous 4.0, calcium 8.9. Alcohol level is negative. His total protein is 7.6, albumin 1.7. total bilirubin 24, direct bilirubin 19, alkaline phosphatase 706, AST 504, ALT 283. Troponin is negative. Urine drug screen is negative. White count is 14,000, hemoglobin 14.2. The patient has microcytosis with MCV and MCH elevated, RDW elevated. There is anisocytosis, microcytosis, and target cells on the differential. INR is 1.4. Prothrombin time is 17. Urinalysis has 9 red blood cells, 13 red blood cells. Tylenol level is negative. HIV screening is negative.   Ultrasound of the liver shows gallbladder not visualized, fatty infiltration of the liver with minimal ascites. CT scan has been ordered.   EKG: No ST depression or elevation. Normal sinus rhythm.   ASSESSMENT AND PLAN: A 64 year old gentleman with history of hypertension, hyperlipidemia, prostate cancer, cerebrovascular accident, coronary artery disease, admitted with acute liver failure.  1.  The patient has acute elevation of LFTs with a bilirubin of 20 and slight coagulopathy with an INR 1.4 and a PT of 17. The patient is not having any acute bleeding. The patient seems to be stable. He was hypotensive, but he actually improved after IV fluids. IV fluids: Prefer isotonic solution. Avoid lactated Ringer's due to possible metabolic acidosis. We do not have a certain etiology of this problem. He used to be a drinker, so cirrhosis of the liver due to alcohol intake could be an option. Since there seems to be acute nature, consider the possibility of fulminant hepatitis, hepatitis A for example. The patient has never had transfusions and  never used IV drugs, for which hepatitis B and hepatitis C are low on the differential. Consider the possibility of cytomegalovirus. The patient also has a flulike syndrome going on, but he is afebrile. Consider the possibility of influenza. The patient has not been exposed to rodents or to dogs to think about other zoonosis like Leptospira. If no other answers, we can send Leptospira titers. The patient's etiology could be also malignancy. As he used to drink, could have a hepatoma primarily, or he also could have a prostate cancer metastasis to the liver or to the pancreas. He has elevation of ferritin and decreased iron-binding capacity, for which the possibility of iron store disease is also there. We are going to do an echocardiogram as part of the work-up to evaluate the possibility of iron-binding disease. Gastroenterology has been consulted. CT scan of the abdomen has been ordered as well. LDH to evaluate the possibility of lymphomas. His LDH level is high, for what we are going to look for lymphadenopathy, although this  could be elevated also in primary liver disease.  2.  Acute kidney injury: The patient has an elevation of creatinine of 3.3likely ATN.Marland Kitchen He was hypotensive. This could be acute tubular necrosis. Consider the possibility of hepatorenal syndrome, though we expect this in chronic hepatitis or cirrhosis. We are going to order urine sodium, which should be significantly elevated in hepatorenal syndrome. We are going to get nephrology involved. IV fluids given to the patient due to hypotension and acute kidney failure.  3.  Hypokalemia: Replace potassium with IV potassium. Recheck potassium tonight.  4.  Elevated alkaline phosphatase: Consider the possibility of prostate cancer with bone metastasis versus primary obstruction of the biliary tree.  5.  Microcytosis: Likely due to liver disease.  6.  Elevation of white blood cells: At this moment, the patient has increased white blood cells in  urine. Consider the possibility of urinary tract infection, although he does not have any major symptoms for it. His chest x-ray did not show any significant signs of pneumonia. We are going to hold on antibiotics for now and get a urine culture. He is afebrile, for which I do not think that there is a need to send cultures for blood.  7.  Hypertension: Hold blood pressure medications. Hold lisinopril due to kidney failure, metoprolol due to hypotension. 8.  Hyperlipidemia: Hold statins due to liver failure.  9.  History of cerebrovascular accident: Continue aspirin. 10.  Coronary artery disease: Continue aspirin. No active symptoms.  11.  The patient is critically ill with life-threatening condition and liver failure. He is not having any major coagulopathy, although his INR is elevated. We are going to monitor closely.  12.  The patient is smoking. Tobacco cessation given over 5 minutes.  - CRITICAL CARE TIME: I spent about 60 minutes with this patient today  NOTE : CT scan reports obtained. pt has severe colitis: subsegmental. this could be the culprid of the Liver failure. this open another big differential diagnosis inclouding Typhilitis, this is unlikely as pt is not neutropenic. -infectious causes like salmonella typhi or thyphoid fever, as mention above some zoonosis like leptosphira, send stool studies inclouding c diff. HUS not likely as pt platelets are normal.  add 20 more mins critical care time : total time of 80 min   .  ____________________________ Felipa Furnace, MD rsg:jcm D: 09/06/2013 18:07:14 ET T: 09/06/2013 19:25:54 ET JOB#: [id number]  cc: Felipa Furnace, MD, <Dictator> Harrie Cazarez Juanda Chance MD ELECTRONICALLY SIGNED 09/07/2013 0:39

## 2015-01-27 NOTE — Consult Note (Signed)
Chief Complaint:  Subjective/Chief Complaint Pt denies abdominal pain, nausea, or vomiting. Eating well.  Pt had large BM without melena or rectal bleeding.   VITAL SIGNS/ANCILLARY NOTES: **Vital Signs.:   12-Dec-14 02:58  Vital Signs Type Q 4hr  Temperature Temperature (F) 97.1  Celsius 36.1  Temperature Source oral  Pulse Pulse 67  Respirations Respirations 17  Systolic BP Systolic BP 106  Diastolic BP (mmHg) Diastolic BP (mmHg) 69  Mean BP 81  Pulse Ox % Pulse Ox % 95  Pulse Ox Activity Level  At rest  Oxygen Delivery Room Air/ 21 %   Brief Assessment:  GEN no acute distress, thin, A/Ox3.   Cardiac Regular  murmur present   Respiratory normal resp effort   Gastrointestinal details normal Soft  Nontender  Nondistended  Bowel sounds normal  No rebound tenderness  No gaurding   EXTR +clubbing, no edema bilat   Additional Physical Exam Skin: warm, dry intact HEENT: mild scleral icterus.   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  TJ liver Bx by IR yesterday.  Await report.   Small pancreatic head cyst: FU MRCP to look for IPMN in 6 mo. Colitis on CT: Still with diarrhea reported but negative fecal WBCs, stool cx & c diff PCR x2 (last 12/11) ARF: Improving.  Appreciate nephrology's input.   Plan 1) FU MRCP in 6 mo 2) continue supportive measures 3)Await biopsy results Please call if you have any questions or concerns   Electronic Signatures: Joselyn ArrowJones, Darby Fleeman L (NP)  (Signed 12-Dec-14 10:02)  Authored: Chief Complaint, VITAL SIGNS/ANCILLARY NOTES, Brief Assessment, Assessment/Plan   Last Updated: 12-Dec-14 10:02 by Joselyn ArrowJones, Luretta Everly L (NP)

## 2015-01-27 NOTE — Consult Note (Signed)
Chief Complaint:  Subjective/Chief Complaint Pt denies abdominal pain, nausea, or vomiting. Eating well.   VITAL SIGNS/ANCILLARY NOTES: **Vital Signs.:   05-Dec-14 13:32  Temperature Temperature (F) 97.9  Celsius 36.6  Temperature Source oral  Pulse Pulse 68  Respirations Respirations 18  Systolic BP Systolic BP 643  Diastolic BP (mmHg) Diastolic BP (mmHg) 73  Mean BP 86  Pulse Ox % Pulse Ox % 99  Pulse Ox Activity Level  At rest  Oxygen Delivery Room Air/ 21 %   Brief Assessment:  GEN no acute distress, thin, A/Ox3.   Cardiac Regular  murmur present   Respiratory normal resp effort   Gastrointestinal details normal Soft  Nontender  Nondistended  Bowel sounds normal   EXTR +clubbing, no edema bilat   Additional Physical Exam Skin: +jaundice HEENT: scleral icterus.   Lab Results: Hepatic:  05-Dec-14 06:49   Bilirubin, Total  17.0  Alkaline Phosphatase  507 (45-117 NOTE: New Reference Range 08/27/13)  SGPT (ALT)  224  SGOT (AST)  321  Total Protein, Serum  6.3  Albumin, Serum  1.3  Routine Chem:  05-Dec-14 06:49   Glucose, Serum  109  BUN  79  Creatinine (comp)  3.70  Sodium, Serum 139  Potassium, Serum 3.5  Chloride, Serum  114  CO2, Serum  15  Calcium (Total), Serum  8.0  Osmolality (calc) 302  eGFR (African American)  19  eGFR (Non-African American)  17 (eGFR values <61m/min/1.73 m2 may be an indication of chronic kidney disease (CKD). Calculated eGFR is useful in patients with stable renal function. The eGFR calculation will not be reliable in acutely ill patients when serum creatinine is changing rapidly. It is not useful in  patients on dialysis. The eGFR calculation may not be applicable to patients at the low and high extremes of body sizes, pregnant women, and vegetarians.)  Result Comment TOTAL BILIRUBIN - RESULTS VERIFIED BY REPEAT TESTING.  - CALLED TO BILL SMITH AT [phone].Marland KitchenMarland KitchenAddyston - READ-BACK PROCESS PERFORMED.  Result(s) reported on 10 Sep 2013 at 07:37AM.  Anion Gap 10   Assessment/Plan:  Assessment/Plan:  Assessment 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  Anti-SMA positive.  Anti-LKM-1 & immunoglobulins pending.  Dr WAllen Norrisexamined pt & discussed his care directly with Dr WEarleen Newporttoday. Small pancreatic head cyst: FU MRCP to look for IPMN in 6 mo.   Right-sided Segmental colitis:  Diarrhea resolved.   Plan 1) FU MRCP in 6 mo 2) continue supportive measures 3) FU pending labs Please call if you have any questions or concerns   Electronic Signatures: JAndria Meuse(NP)  (Signed 05-Dec-14 17:58)  Authored: Chief Complaint, VITAL SIGNS/ANCILLARY NOTES, Brief Assessment, Lab Results, Assessment/Plan   Last Updated: 05-Dec-14 17:58 by JAndria Meuse(NP)

## 2015-01-27 NOTE — Consult Note (Signed)
Chief Complaint:  Subjective/Chief Complaint Pt notes several loose diarrheal stools since admission.  Says "I just don't feel well".  c/o nausea without emesis. Denies abdominal pain.   VITAL SIGNS/ANCILLARY NOTES: **Vital Signs.:   02-Dec-14 10:12  Temperature Temperature (F) 97.8  Celsius 36.5  Temperature Source oral  Pulse Pulse 78  Respirations Respirations 18  Systolic BP Systolic BP 353  Diastolic BP (mmHg) Diastolic BP (mmHg) 75  Mean BP 89  Pulse Ox % Pulse Ox % 100  Pulse Ox Activity Level  At rest  Oxygen Delivery Room Air/ 21 %   Brief Assessment:  GEN no acute distress, thin, A/Ox3.   Cardiac Regular  murmur present   Respiratory normal resp effort   Gastrointestinal details normal Soft  Nondistended  Bowel sounds normal  +LLQ tenderness   EXTR +clubbing, no edema bilat   Additional Physical Exam Skin: +jaundice HEENT: scleral icterus.   Lab Results:  Thyroid:  02-Dec-14 02:43   Thyroid Stimulating Hormone  0.03 (0.45-4.50 (International Unit)  ----------------------- Pregnant patients have  different reference  ranges for TSH:  - - - - - - - - - -  Pregnant, first trimetser:  0.36 - 2.50 uIU/mL)  LabObservation:  02-Dec-14 11:16   OBSERVATION Reason for Test  Hepatic:  02-Dec-14 02:43   Bilirubin, Total  23.4  Alkaline Phosphatase  665 (45-117 NOTE: New Reference Range 08/27/13)  SGPT (ALT)  272  SGOT (AST)  528  Total Protein, Serum 6.8  Albumin, Serum  1.5  Routine Micro:  02-Dec-14 02:31   Micro Text Report CLOSTRIDIUM DIFFICILE   C.DIFFICILE ANTIGEN       C.DIFFICILE GDH ANTIGEN : NEGATIVE   C.DIFFICILE TOXIN A/B     C.DIFFICILE TOXINS A AND B : NEGATIVE   INTERPRETATION            Negative for C. difficile.    ANTIBIOTIC                        Micro Text Report INFLUENZA A+B ANTIGENS   COMMENT                   NEGATIVE FOR INFLUENZA A (ANTIGEN ABSENT)   COMMENT                   NEGATIVE FOR INFLUENZA B (ANTIGEN ABSENT)    ANTIBIOTIC                       Comment 1.. NEGATIVE FOR INFLUENZA A (ANTIGEN ABSENT) A negative result does not exclude influenza. Correlation with clinical impression is required.  Comment 2.. NEGATIVE FOR INFLUENZA B (ANTIGEN ABSENT)  Result(s) reported on 07 Sep 2013 at 03:48AM.    02:43   Micro Text Report BLOOD CULTURE   COMMENT                   NO GROWTH IN 8-12 HOURS   ANTIBIOTIC                       Culture Comment NO GROWTH IN 8-12 HOURS  Result(s) reported on 07 Sep 2013 at 11:00AM.    03:00   Micro Text Report BLOOD CULTURE   COMMENT                   NO GROWTH IN 8-12 HOURS   ANTIBIOTIC  Culture Comment NO GROWTH IN 8-12 HOURS  Result(s) reported on 07 Sep 2013 at 11:00AM.  Lab:  02-Dec-14 02:55   Lactic Acid, Cardiopulmonary  1.4 (Result(s) reported on 07 Sep 2013 at 03:03AM.)  Routine Chem:  02-Dec-14 02:43   Glucose, Serum 97  BUN  68  Creatinine (comp)  2.66  Sodium, Serum 140  Potassium, Serum  3.3  Chloride, Serum  111  CO2, Serum  17  Calcium (Total), Serum  8.2  Osmolality (calc) 299  eGFR (African American)  29  eGFR (Non-African American)  25 (eGFR values <16m/min/1.73 m2 may be an indication of chronic kidney disease (CKD). Calculated eGFR is useful in patients with stable renal function. The eGFR calculation will not be reliable in acutely ill patients when serum creatinine is changing rapidly. It is not useful in  patients on dialysis. The eGFR calculation may not be applicable to patients at the low and high extremes of body sizes, pregnant women, and vegetarians.)  Anion Gap 12  Magnesium, Serum 2.4 (1.8-2.4 THERAPEUTIC RANGE: 4-7 mg/dL TOXIC: > 10 mg/dL  -----------------------)  Hemoglobin A1c ([facility]) 5.3 (The American Diabetes Association recommends that a primary goal of therapy should be <7% and that physicians should reevaluate the treatment regimen in patients with HbA1c values consistently >8%.)   Cholesterol, Serum  233  Triglycerides, Serum 154  HDL (INHOUSE)  9  VLDL Cholesterol Calculated 31  LDL Cholesterol Calculated  193 (Result(s) reported on 07 Sep 2013 at 03:52AM.)  Routine Coag:  02-Dec-14 02:43   Prothrombin  16.3  INR 1.3 (INR reference interval applies to patients on anticoagulant therapy. A single INR therapeutic range for coumarins is not optimal for all indications; however, the suggested range for most indications is 2.0 - 3.0. Exceptions to the INR Reference Range may include: Prosthetic heart valves, acute myocardial infarction, prevention of myocardial infarction, and combinations of aspirin and anticoagulant. The need for a higher or lower target INR must be assessed individually. Reference: The Pharmacology and Management of the Vitamin K  antagonists: the seventh ACCP Conference on Antithrombotic and Thrombolytic Therapy. CEVOJJ.0093Sept:126 (3suppl): [id number] A HCT value >55% may artifactually increase the PT.  In one study,  the increase was an average of 25%. Reference:  "Effect on Routine and Special Coagulation Testing Values of Citrate Anticoagulant Adjustment in Patients with High HCT Values." American Journal of Clinical Pathology 2006;126:400-405.)  Routine Hem:  02-Dec-14 02:43   Manual Diff MANUAL DIFF DONE  Result(s) reported on 07 Sep 2013 at 05:48AM.  WBC (CBC)  13.2  RBC (CBC)  3.50  Hemoglobin (CBC)  12.6  Hematocrit (CBC)  36.8  Platelet Count (CBC) 208 (Result(s) reported on 07 Sep 2013 at 04:02AM.)  MCV  105  MCH  36.0  MCHC 34.3  RDW  18.8  Segmented Neutrophils 78  Lymphocytes 14  Monocytes 8  Diff Comment 1 ANISOCYTOSIS  Diff Comment 2 POIKILOCYTOSIS  Diff Comment 3 TARGET CELLS  Diff Comment 4 PLTS VARIED IN SIZE  Result(s) reported on 07 Sep 2013 at 04:02AM.   Radiology Results: [place]    01-Dec-14 14:49, [place]  [place]   REASON FOR EXAM:    marked LFT elevation  COMMENTS:    Body Site: GB and Fossa, CBD    PROCEDURE: [place] - [place] SURVEY  - Sep 06 2013  2:49PM     CLINICAL DATA:  Elevated LFTs.    EXAM:  [place] -  RIGHT UPPER QUADRANT    COMPARISON:  None.    FINDINGS:  Gallbladder  Not visualized. Patient states he has not had a prior  cholecystectomy. Gallbladder may therefore be contracted. Negative  sonographic Murphy's sign.    Common bile duct    Diameter: 4.5 mm.    Liver:    Increasedechogenicity suggesting mild fatty infiltration. No  definite focal mass. Small amount of perihepatic fluid.     IMPRESSION:  Gallbladder not visualized.  Fatty infiltration of the liver.  Minimal ascites.      Electronically Signed    By: Marin Olp M.D.    On: 09/06/2013 14:54         Verified By: Pearletha Alfred, M.D.,  CT:    01-Dec-14 21:16, CT Abdomen and Pelvis Without Contrast  CT Abdomen and Pelvis Without Contrast   REASON FOR EXAM:    (1) LIVER FAILURE; (2) SAME  COMMENTS:       PROCEDURE: CT  - CT ABDOMEN AND PELVIS W0  - Sep 06 2013  9:16PM     CLINICAL DATA:  Acute liver failure    EXAM:  CT ABDOMEN AND PELVIS WITHOUT CONTRAST    TECHNIQUE:  Multidetector CTimaging of the abdomen and pelvis was performed  following the standard protocol without intravenous contrast.    COMPARISON:  None.  FINDINGS:  There is mild interstitial edema at the lung bases. No pleural  fluid. No pericardial fluid.    Non IV contrast images demonstrate no focal hepatic lesion. There is  small amount of intraperitoneal free fluid along the right hepatic  margin. Gallbladder is not demonstrated. The pancreas, spleen,  adrenal glands, and kidneys demonstrate no acute findings.    The stomach, small bowel and small bowel are normal. No evidence of  small bowel obstruction. The cecum and ascending colon demonstrate  circumferential bowel wall thickening and edema up to 15 mm in mural  thickness. The transverse colon is  normal caliber and collapse. The  descending colon and sigmoid colon are normal.  Abdominal aorta is heavily calcified.  No evidence of aneurysm.    There is no free fluid the pelvis. Prostate gland is small. Foley  catheterization within the collapsed bladder. No aggressive osseous  lesion.     IMPRESSION:  1. Segmental colitis of the cecum and ascending colon. Differential  infectious colitis (including drug induced C difficile),  inflammatory bowel disease, and ischemic colitis. Typhlitis would be  a consideration if patient is neutropenic.  2. Small amount of ascites surrounding the liver.  3. No evidence of bowel obstruction.    Electronically Signed    By: Suzy Bouchard M.D.    On: 09/06/2013 21:52         Verified By: Rennis Golden, M.D.,   Assessment/Plan:  Assessment/Plan:  Assessment Painless jaundice/Hepatitis/cirrhosis:  Hepatitis C antibody positive.  Will check for active viremia.  Hx ETOH abuse as well. MELD 34, condition guarded.  MRCP to look for stricture, occult mass, or stone.  Check for hemochromatosis, PSC, PBC as well.  Care has been discussed with Dr Allen Norris. Right-sided Segmental colitis:  Infectious vs.IBD vs. ischemia.  Await stool studies. C diff negative.  On zosyn & flagyl with adequate empiric coverage. Abnormal TSH: Per attending. Hypokalemia: Inproving. Per attending   Plan 1) FU stool studies 2) MRCP 3) HCV quant RNA by PCR with reflex genotype , ANA, AMA, DNA for hemochromatosis 4) K repletion per attending 5) thyroid management per  attending Please call if you have any questions or concerns   Electronic Signatures: Andria Meuse (NP)  (Signed 02-Dec-14 13:09)  Authored: Chief Complaint, VITAL SIGNS/ANCILLARY NOTES, Brief Assessment, Lab Results, Radiology Results, Assessment/Plan   Last Updated: 02-Dec-14 13:09 by Andria Meuse (NP)

## 2015-01-27 NOTE — Consult Note (Signed)
Chief Complaint:  Subjective/Chief Complaint Patient with HCV cirrhosis and increased LFT's. No labs back today. Denies any pain today.   Brief Assessment:  GEN well nourished   Respiratory normal resp effort  no use of accessory muscles   Gastrointestinal Normal   Gastrointestinal details normal Soft  Nontender   Lab Results: Hepatic:  05-Dec-14 06:49   Bilirubin, Total  17.0  Alkaline Phosphatase  507 (45-117 NOTE: New Reference Range 08/27/13)  SGPT (ALT)  224  SGOT (AST)  321  Total Protein, Serum  6.3  Albumin, Serum  1.3  General Ref:  05-Dec-14 06:49   Liver-Kidney Microsomal Ab ========== TEST NAME ==========  ========= RESULTS =========  = REFERENCE RANGE =  LIVER-KIDNEY MICROSML AB  Liver-Kidney Microsomal Ab Liver-Kidney Microsomal Ab      [   Result Pending       ]                                 [facility]            No: [id number]           [address], [place], [place] [address]           Lindon Romp, MD         [phone]   Result(s) reported on 11 Sep 2013 at 08:17AM.  Immunoglobulins, Qt, IgA, IgG, IgM, Serum ========== TEST NAME ==========  ========= RESULTS =========  = REFERENCE RANGE =  PROT IMMUNOGLOBULINS  Immunoglobulins A/G/M, Qn, Ser Immunoglobulin G, Qn, Serum     [H  2335 mg/dL           ]          [phone] Immunoglobulin A, Qn, Serum[H  655 mg/dL            ]            91-414 Immunoglobulin M, Qn, Serum     [   154 mg/dL            ]            [address]               [facility]            No: [id number]           [address], [place], [place] [address] Lindon Romp, MD         [phone]   Result(s) reported on 11 Sep 2013 at 08:17AM.  Routine Chem:  05-Dec-14 06:49   Glucose, Serum  109  BUN  79  Creatinine (comp)  3.70  Sodium, Serum 139  Potassium, Serum 3.5  Chloride, Serum  114  CO2, Serum  15  Calcium (Total), Serum  8.0  Osmolality (calc) 302  eGFR (African American)  19   eGFR (Non-African American)  17 (eGFR values <50m/min/1.73 m2 may be an indication of chronic kidney disease (CKD). Calculated eGFR is useful in patients with stable renal function. The eGFR calculation will not be reliable in acutely ill patients when serum creatinine is changing rapidly. It is not useful in  patients on dialysis. The eGFR calculation may not be applicable to patients at the low and high extremes of body sizes, pregnant women, and vegetarians.)  Result Comment TOTAL BILIRUBIN - RESULTS VERIFIED BY REPEAT TESTING.  - CALLED TO BILL SMITH AT [phone].Marland KitchenMarland KitchenTerral - READ-BACK PROCESS PERFORMED.  Result(s) reported on 10 Sep 2013 at 07:37AM.  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   Plan The patient has had lower LFT's with increased INR. No labs back today. Will order labs for tomorrow. Continue current supportive care.   Electronic Signatures: Lucilla Lame (MD)  (Signed 06-Dec-14 09:04)  Authored: Chief Complaint, Brief Assessment, Lab Results, Assessment/Plan   Last Updated: 06-Dec-14 09:04 by Lucilla Lame (MD)

## 2015-01-27 NOTE — Consult Note (Signed)
Chief Complaint:  Subjective/Chief Complaint Pt denies abdominal pain, nausea, or vomiting. Eating well.   VITAL SIGNS/ANCILLARY NOTES: **Vital Signs.:   08-Dec-14 01:38  Respirations Respirations 18    10:33  Vital Signs Type Q 4hr  Temperature Temperature (F) 97.2  Celsius 36.2  Temperature Source oral  Pulse Pulse 83  Respirations Respirations 17  Systolic BP Systolic BP 967  Diastolic BP (mmHg) Diastolic BP (mmHg) 83  Mean BP 96  Pulse Ox % Pulse Ox % 99  Pulse Ox Activity Level  At rest  Oxygen Delivery Room Air/ 21 %   Brief Assessment:  GEN no acute distress, thin, A/Ox3.   Cardiac Regular  murmur present   Respiratory normal resp effort   Gastrointestinal details normal Soft  Nontender  Nondistended  Bowel sounds normal   EXTR +clubbing, no edema bilat   Additional Physical Exam Skin: warm, dry intact HEENT: mild scleral icterus.   Lab Results: Hepatic:  08-Dec-14 05:22   Bilirubin, Total  13.4  Bilirubin, Direct  10.80 (Result(s) reported on 13 Sep 2013 at 06:01AM.)  Alkaline Phosphatase  414 (45-117 NOTE: New Reference Range 08/27/13)  SGPT (ALT)  160  SGOT (AST)  176  Total Protein, Serum  6.2  Albumin, Serum  1.3  Routine Chem:  08-Dec-14 05:22   Glucose, Serum  143  BUN  122  Creatinine (comp)  5.88  Sodium, Serum 140  Potassium, Serum 4.2  Chloride, Serum  116  CO2, Serum  12  Calcium (Total), Serum  8.1  Anion Gap 12  Osmolality (calc) 321  eGFR (African American)  11  eGFR (Non-African American)  9 (eGFR values <12m/min/1.73 m2 may be an indication of chronic kidney disease (CKD). Calculated eGFR is useful in patients with stable renal function. The eGFR calculation will not be reliable in acutely ill patients when serum creatinine is changing rapidly. It is not useful in  patients on dialysis. The eGFR calculation may not be applicable to patients at the low and high extremes of body sizes, pregnant women, and vegetarians.)   Routine Coag:  08-Dec-14 05:22   Prothrombin  19.8  INR 1.7 (INR reference interval applies to patients on anticoagulant therapy. A single INR therapeutic range for coumarins is not optimal for all indications; however, the suggested range for most indications is 2.0 - 3.0. Exceptions to the INR Reference Range may include: Prosthetic heart valves, acute myocardial infarction, prevention of myocardial infarction, and combinations of aspirin and anticoagulant. The need for a higher or lower target INR must be assessed individually. Reference: The Pharmacology and Management of the Vitamin K  antagonists: the seventh ACCP Conference on Antithrombotic and Thrombolytic Therapy. CRFFMB.8466Sept:126 (3suppl): [id number] A HCT value >55% may artifactually increase the PT.  In one study,  the increase was an average of 25%. Reference:  "Effect on Routine and Special Coagulation Testing Values of Citrate Anticoagulant Adjustment in Patients with High HCT Values." American Journal of Clinical Pathology 2006;126:400-405.)   Assessment/Plan:  Assessment/Plan:  Assessment 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  Recommend liver biopsy once bleeding risk declines. Small pancreatic head cyst: FU MRCP to look for IPMN in 6 mo.   Plan 1) FU MRCP in 6 mo 2) continue supportive measures 3) Liver bx once INR down or consider transjugular if available at another facility 4) CBC, LFTS, INR in AM Please call if you have any questions or concerns   Electronic Signatures: JVickey Huger  L (NP)  (Signed 08-Dec-14 11:33)  Authored: Chief Complaint, VITAL SIGNS/ANCILLARY NOTES, Brief Assessment, Lab Results, Assessment/Plan   Last Updated: 08-Dec-14 11:33 by Andria Meuse (NP)

## 2015-01-27 NOTE — Consult Note (Signed)
Chief Complaint:  Subjective/Chief Complaint Pt denies abdominal pain, nausea, or vomiting. Eating well.  Still c/o few loose stools.   VITAL SIGNS/ANCILLARY NOTES: **Vital Signs.:   04-Dec-14 10:08  Vital Signs Type Q 4hr  Temperature Temperature (F) 97.6  Celsius 36.4  Pulse Pulse 71  Respirations Respirations 18  Systolic BP Systolic BP 119  Diastolic BP (mmHg) Diastolic BP (mmHg) 70  Mean BP 83  Pulse Ox % Pulse Ox % 99  Pulse Ox Activity Level  At rest  Oxygen Delivery Room Air/ 21 %   Brief Assessment:  GEN no acute distress, thin, A/Ox3.   Cardiac Regular  murmur present   Respiratory normal resp effort   Gastrointestinal details normal Soft  Nontender  Nondistended  Bowel sounds normal   EXTR +clubbing, no edema bilat   Additional Physical Exam Skin: +jaundice HEENT: scleral icterus.   Lab Results: Hepatic:  04-Dec-14 05:55   Bilirubin, Total  18.5  Bilirubin, Direct  13.70 (Result(s) reported on 09 Sep 2013 at 11:48AM.)  Alkaline Phosphatase  580 (45-117 NOTE: New Reference Range 08/27/13)  SGPT (ALT)  241  SGOT (AST)  364  Albumin, Serum  1.3  General Ref:  02-Dec-14 13:27   Hered.Hemochromatosis,DNA ========== TEST NAME ==========  ========= RESULTS =========  = REFERENCE RANGE =  HEREDITARY HEMOCHROMOTOS   Routine Chem:  04-Dec-14 05:55   Glucose, Serum  111  BUN  67  Creatinine (comp)  2.92  Sodium, Serum 141  Potassium, Serum  3.4  Chloride, Serum  116  CO2, Serum  17  Calcium (Total), Serum  8.1  Osmolality (calc) 301  eGFR (African American)  25  eGFR (Non-African American)  22 (eGFR values <42m/min/1.73 m2 may be an indication of chronic kidney disease (CKD). Calculated eGFR is useful in patients with stable renal function. The eGFR calculation will not be reliable in acutely ill patients when serum creatinine is changing rapidly. It is not useful in  patients on dialysis. The eGFR calculation may not be applicable to patients at  the low and high extremes of body sizes, pregnant women, and vegetarians.)  Anion Gap 8  Result Comment CBC - ORIGINAL SAMPLE SPUN AND SEPARATED FOR  - REFERENCE TESTING-NOTIFIED MICHAEL,RN  - FOR RECOLLECT,AND PATIENT REFUSED...qsd  Result(s) reported on 09 Sep 2013 at 11:46AM.  Ferritin ([facility]  1926 (Result(s) reported on 09 Sep 2013 at 07:21AM.)  Iron Binding Capacity (TIBC)  148  Unbound Iron Binding Capacity 11  Iron, Serum 137  Iron Saturation 93 (Result(s) reported on 09 Sep 2013 at 07:47AM.)  Phosphorus, Serum 3.1 (Result(s) reported on 09 Sep 2013 at 07:21AM.)  Uric Acid, Serum 4.2 (Result(s) reported on 09 Sep 2013 at 07:21AM.)  Misc Urine Chem:  04-Dec-14 02:25   Creatinine, Urine 105.1  Protein, Random Urine  48  Protein/Creat Ratio (comp)  457 (Result(s) reported on 09 Sep 2013 at 03:16AM.)  Cardiac:  04-Dec-14 05:55   CK, Total 75 (Result(s) reported on 09 Sep 2013 at 07:21AM.)  Routine Coag:  04-Dec-14 05:55   Prothrombin  20.4  INR 1.8 (INR reference interval applies to patients on anticoagulant therapy. A single INR therapeutic range for coumarins is not optimal for all indications; however, the suggested range for most indications is 2.0 - 3.0. Exceptions to the INR Reference Range may include: Prosthetic heart valves, acute myocardial infarction, prevention of myocardial infarction, and combinations of aspirin and anticoagulant. The need for a higher or lower target INR must be assessed individually. Reference: The  Pharmacology and Management of the Vitamin K  antagonists: the seventh ACCP Conference on Antithrombotic and Thrombolytic Therapy. ECXFQ.7225 Sept:126 (3suppl): [id number]. A HCT value >55% may artifactually increase the PT.  In one study,  the increase was an average of 25%. Reference:  "Effect on Routine and Special Coagulation Testing Values of Citrate Anticoagulant Adjustment in Patients with High HCT Values." American Journal of Clinical  Pathology 2006;126:400-405.)  Routine Hem:  04-Dec-14 05:55   WBC (CBC) -  RBC (CBC) -  Hemoglobin (CBC) -  Hematocrit (CBC) -  Platelet Count (CBC) -  MCV -  MCH -  MCHC -  RDW -  Neutrophil % -  Lymphocyte % -  Monocyte % -  Eosinophil % -  Basophil % -  Neutrophil # -  Lymphocyte # -  Monocyte # -  Eosinophil # -  Basophil # -  Bands -  Segmented Neutrophils -  Lymphocytes -  Variant Lymphocytes -  Monocytes -  Eosinophil -  Basophil -  Metamyelocyte -  Myelocyte -  Promyelocyte -  Blast-Like -  Other Cells -  NRBC -  Diff Comment 1 -  Diff Comment 2 -  Diff Comment 3 -  Diff Comment 4 -  Diff Comment 5 -  Diff Comment 6 -  Diff Comment 7 -  Diff Comment 8 -  Diff Comment 9 -  Diff Comment 10 - (Result(s) reported on 09 Sep 2013 at 11:46AM.)   Assessment/Plan:  Assessment/Plan:  Assessment Painless jaundice/Hepatitis/Acute on chronic cirrhosis:  LFTs improving.  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  Will check anti-LKM-1 & immunoglobulins to look for other indicators since ANA is negative.  TSH is low & auto-immune thyroiditis can go hand in hand with AIH so we need to keep that in mind.  I did discuss with Dr Earleen Newport.  Hepatitis C antibody positive, awaiting RNA/PCR.  Hx ETOH abuse as well. Condition guarded.  MRCP results reviewed with patient.  Nothing acute on MRCP.  Small pancreatic head cyst, will need 6 month FU MRCP to look for IPMN.  No evidence of PSC.   Awaiting hemochromatosis labs, AMA, Alpha 1-antitrypsin.  Will discuss care with Dr Allen Norris.  Pt on prednisolone already by Dr Earleen Newport. Right-sided Segmental colitis:  IBD vs. ischemia.  Stool studies negative. C diff negative.   Hypokalemia: Improving. Per attending   Plan 1) FU MRCP in 6 mo 2) FU HCV quant RNA by PCR with reflex genotype , AMA, DNA for hemochromatosis, A1AT, immunoglobulins & anti-LKM-1 3) continue supportive  measures Please call if you have any questions or concerns   Electronic Signatures: Andria Meuse (NP)  (Signed 04-Dec-14 14:15)  Authored: Chief Complaint, VITAL SIGNS/ANCILLARY NOTES, Brief Assessment, Lab Results, Assessment/Plan   Last Updated: 04-Dec-14 14:15 by Andria Meuse (NP)

## 2015-01-28 NOTE — Op Note (Signed)
PATIENT NAME:  Tommy Simpson, Spurgeon MR#:  [id number] DATE OF BIRTH:  1951/06/11  DATE OF PROCEDURE:  09/22/2013  PREOPERATIVE DIAGNOSES: 1.  End-stage renal disease requiring hemodialysis.  2.  Liver failure.  3.  Encephalopathy.   POSTOPERATIVE DIAGNOSES: 1.  End-stage renal disease requiring hemodialysis.  2.  Liver failure.  3.  Encephalopathy.   PROCEDURE PERFORMED: Insertion of right IJ cuffed tunneled dialysis catheter, 19 cm tip-to-cuff Cannon type.   SURGEON: Renford DillsGregory G. Jennet Scroggin, M.D.   SEDATION Versed 2 mg plus fentanyl 50 mcg administered IV. Continuous ECG, pulse oximetry and cardiopulmonary monitoring is performed throughout the entire procedure by the interventional radiology nurse. Total sedation time is 40 minutes.   ACCESS: Right IJ.   FLUOROSCOPY TIME: 0.5 minutes.   CONTRAST USED: None.   INDICATIONS: [name] is a 64 year old gentleman with multiple medical problems who is now requiring hemodialysis. He has done fairly well with his femoral catheter, but he is now undergoing placement of a cuffed tunneled catheter in preparation for discharge from the hospital. Risks and benefits were described in detail to the patient, wife was present. All questions were answered. All are in agreement with proceeding.   DESCRIPTION OF PROCEDURE: The patient is taken to special procedures and placed in the supine position. After adequate sedation is achieved, the right neck and chest wall are prepped and draped in a sterile fashion. Ultrasound is placed in a sterile sleeve. Appropriate timeout is called.   Lidocaine 1% is infiltrated in the soft tissues on the anterior chest wall, as well as at the base of the neck. Ultrasound is used to visualize the jugular vein, which is echolucent and compressible indicating patency. Image is recorded for the permanent record. A Seldinger needle is inserted under direct ultrasound visualization. J-wire is advanced under fluoroscopic visualization into  the inferior vena cava. Counterincision is made at the wire insertion site, and a small pocket fashioned with blunt dissection using a hemostat.   Dilator is then passed over the wire, and the dilator and peel-away sheath are inserted. A 19 cm tip-to-cuff Cannon catheter is inserted. The peel-away sheath is removed. Under fluoroscopic guidance, the catheter tips are positioned at the atriocaval junction, and the catheter is approximated to the chest wall. Exit site is selected. A small incision is made, and the tunneling device is passed subcutaneously. The catheter is then pulled through the subcutaneous tunnel. It is transected, and the hub is connected without difficulty. Both lumens aspirate and flush easily. Review of the catheter under fluoroscopy demonstrates that it had a smooth contour, free of kinks, and the tips are in the atriocaval junction. Both lumens are then packed with 5000 units of heparin per lumen. The catheter is secured to the skin of the chest wall with 0 silk, and a 4-0 Monocryl is used to secure at the neck counterincision, followed by Dermabond. The patient tolerated the procedure well. There were no immediate complications. Sponge and needle counts were correct, and he was taken to the recovery area in excellent condition.     ____________________________ Renford DillsGregory G. Edgard Debord, MD ggs:dmm D: 09/22/2013 17:18:27 ET T: 09/22/2013 21:17:47 ET JOB#: [id number]  cc: Renford DillsGregory G. Alek Poncedeleon, MD, <Dictator> Renford DillsGREGORY G Caryssa Elzey MD ELECTRONICALLY SIGNED 10/12/2013 19:29

## 2015-01-28 NOTE — Discharge Summary (Signed)
PATIENT NAME:  Tommy Simpson, Tommy Simpson MR#:  [id number] DATE OF BIRTH:  1951/05/15  DATE OF ADMISSION:  09/06/2013 DATE OF DISCHARGE:  09/25/2013  ADMITTING PHYSICIAN:  Dr. Laurin Coder.    DISCHARGING PHYSICIAN: Gladstone Lighter, M.D.  DISCHARGE DIAGNOSES: 1.  Acute renal failure, currently on hemodialysis, status post PermCath placement.  2.  Acute on chronic liver failure.  3.  Liver cirrhosis.  4.  Hepatitis C.   5.  Chronic colitis, finished antibiotics.  6.  Chronic diarrhea.  7.  Failure to thrive.  8.  History of cerebrovascular accident with left-sided weakness.  9.  Euthyroid sick syndrome.  10.  Hypertension.  11.  Hyperlipidemia.  12.  Coronary artery disease.  13.  Severe malnutrition.  14.  Vitamin D deficiency.  15.  Persistent leukocytosis,  16.  Hypoalbuminemia, receiving albumin infusions with dialysis.  17.  Anasarca.  18.  Chronic thrombocytopenia.   DISCHARGE MEDICATIONS: 1.  Metoprolol 25 mg p.o. b.i.d.  2.  Sodium bicarbonate 650 mg p.o. b.i.d.  3.  Cholestyramine 4 grams daily.  4.  Vitamin D2, 50,000 international units 1 capsule once a week.  5.  Protonix 40 mg p.o. daily.  6.  Temazepam 7.5 mg at bedtime as needed.  7.  Prednisone 40 mg for seven days, 30 mg for seven days, then 20 mg for seven days and 10 mg for seven days and stop; this is for the possibility of autoimmune hepatitis.  8.   Oxycodone 2.5 mg q6 hour's p.r.n. for pain.   DISCHARGE DIET: Renal diet. Magic Cup with meals 3 times a day.    DISCHARGE ACTIVITY: As tolerated.   FOLLOWUP INSTRUCTIONS: 1.  The patient will be transferred to a long-term acute care facility when therapy and will need to be seen by physician after transfer.   2.  Nephrology consult over there for dialysis.  3.  Weekly liver function tests to be checked up.  4.  Gastrointestinal follow-up in two weeks. The patient will need outpatient colonoscopy.  LABS AND IMAGING STUDIES:  WBC 17.4, hemoglobin 10.3, hematocrit 30.1,  platelet count is 47.  Sodium 139, potassium 3.3, chloride 103, bicarb 29, BUN 54, creatinine 2.6, glucose 198 and calcium of 7.9,  ALT 110, AST 183, alk phos 406 total bili 8.9, albumin of 1.6. INR is 1.9. CT of the abdomen and pelvis without contrast showing changes of ascites and anasarca and cirrhosis, cholelithiasis noted, thickening of the ascending colon and small bilateral pleural effusions and bibasilar strict aseptic changes noted.   BRIEF HOSPITAL COURSE: For more details, please look at the history and physical dictated by Dr. Laurin Coder on 12/0/2014 and also look at the interim summary dictated by Dr. Dustin Flock on 09/17/2013. Tommy Simpson, in brief, is a 64 year old African-American male with multiple medical problems, including hepatitis C, liver cirrhosis, history of coronary artery disease, stroke with left-sided weakness, who was at home who was brought in secondary to malaise, weakness, jaundice and pruritus. He was noted to be in acute liver failure and acute renal failure.  1.  Acute renal failure, not sure if it is ATN versus hepatorenal syndrome. Initial fluid resuscitation to salvage the kidneys did not improve and started to get worse so we started on hemodialysis. At this time his renal failure is secondary to the acute renal failure. He has a PermCath placed in and will continue to need dialysis. Creatinine is improving and urine output has been improving so far. He, of course, was followed by nephrology  while in the hospital.  2.  Acute on chronic liver failure. The patient has underlying and complains hepatitis C liver cirrhosis and comes in with acute liver failure. His LFTs have been consistently increasing and today is the first day that his liver tests are shown some improvement. He had a liver biopsy done which did not show any autoimmune pattern, only changes of worsening cirrhosis and fibrosis were noted. Also, has some congestive hepatomegaly with significant anasarca all  over. He was started on prednisone taper for possible autoimmune hepatitis.  GI wanted to continue and finish out the taper over the next month.   3.  Chronic diarrhea with ascending colon thickening, as noted on the CT scan.  C. Diff has been negative multiple times and stool cultures were negative. He was treated with Cipro and Flagyl and has finished off the course and will not be on any antibiotics at discharge. Diarrhea is improved significantly. Also, started on Questran to help with that and also with pruritus he has been having.  4.  Anasarca secondary to renal failure and also hypoalbuminemia. He has been getting albumin infusions during dialysis and fluids being taken out during dialysis.  5.  History of cerebrovascular accident with left-sided weakness. Will need physical therapy once his acute issues are resolved.  6.  Vitamin B deficiency, being replaced on a monthly basis at this time.   His course has been, otherwise, uneventful in the hospital.   DISCHARGE CONDITION: Stable.   DISCHARGE DISPOSITION: To selected long-term acute care facility.   TIME SPENT ON DISCHARGE: 45 minutes.   ____________________________ Gladstone Lighter, MD rk:NTS D: 09/25/2013 12:25:34 ET T: 09/25/2013 23:26:27 ET JOB#: [id number]  cc: Gladstone Lighter, MD, <Dictator> Gladstone Lighter MD ELECTRONICALLY SIGNED 10/19/2013 11:23

## 2015-07-08 IMAGING — CT CT ABD-PELV W/O CM
2 of 4 series · 16 of 46 positions shown, 18 images · non-contrast
Comparison: 09/06/2013

CLINICAL DATA: Abdominal pain

EXAM:
CT ABDOMEN AND PELVIS WITHOUT CONTRAST
TECHNIQUE: Multidetector CT imaging of the abdomen and pelvis was performed
following the standard protocol without intravenous contrast.

[Series 2: routine abd pel without · axial · non-contrast · 0.72mm/px · z∈[-1220,-766]mm · 13 of 101 slices shown, 15 images]
[im 5/101  soft-tissue]
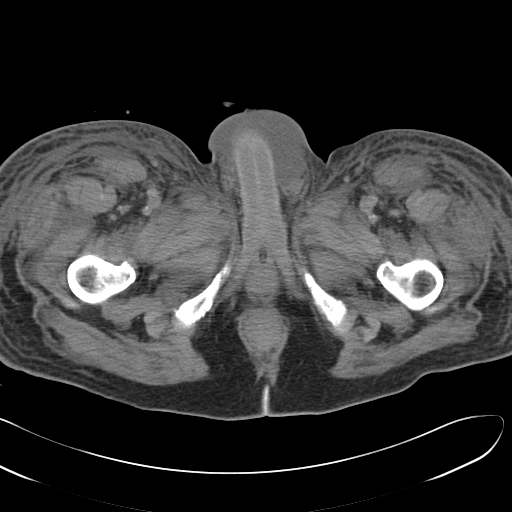
[im 5/101  bone]
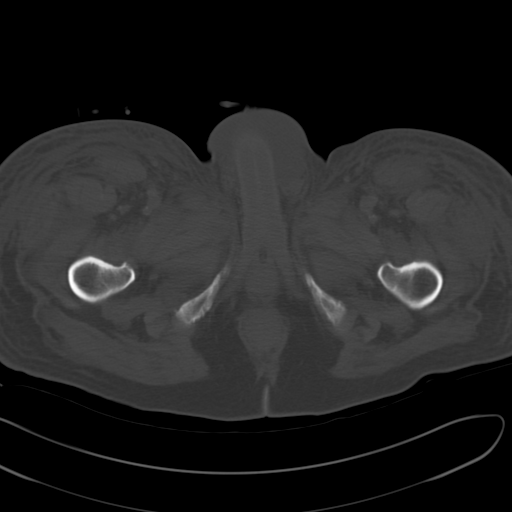
[im 13/101  soft-tissue]
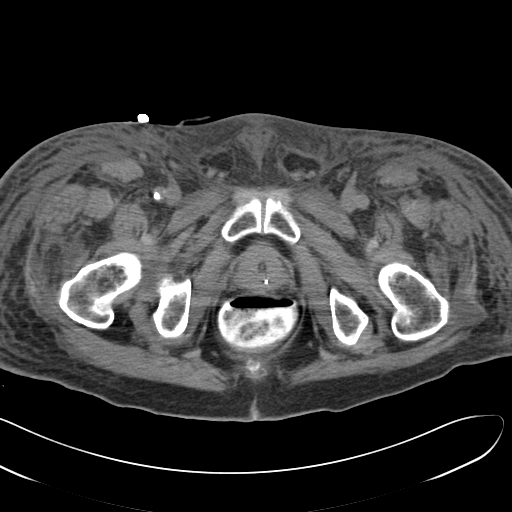
[im 21/101  soft-tissue]
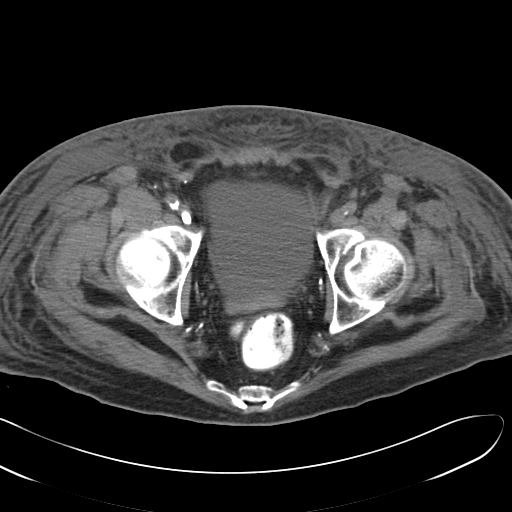
[im 30/101  soft-tissue]
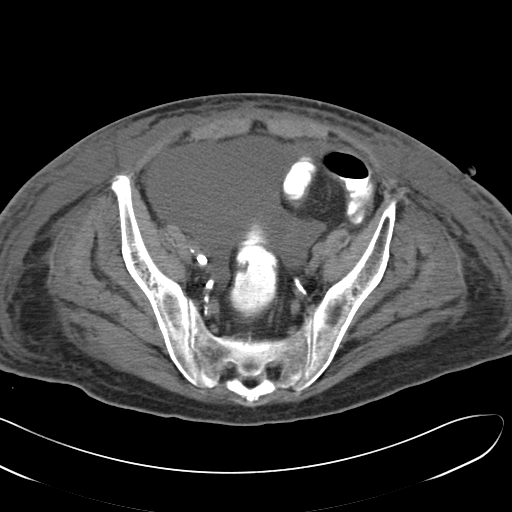
[im 34/101  soft-tissue]
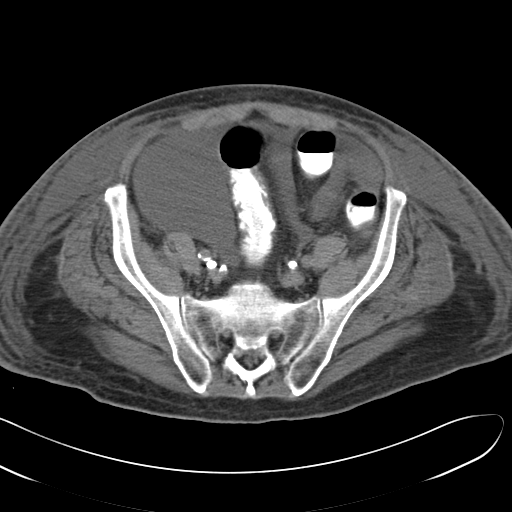
[im 42/101  soft-tissue]
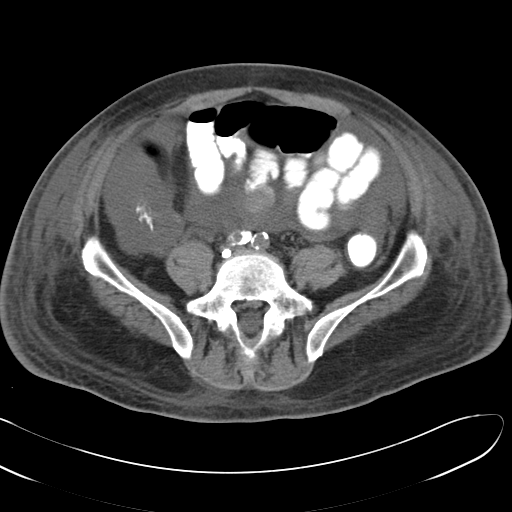
[im 51/101  soft-tissue]
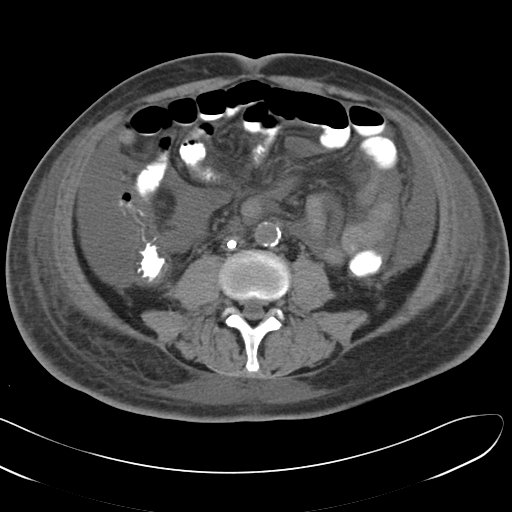
[im 59/101  soft-tissue]
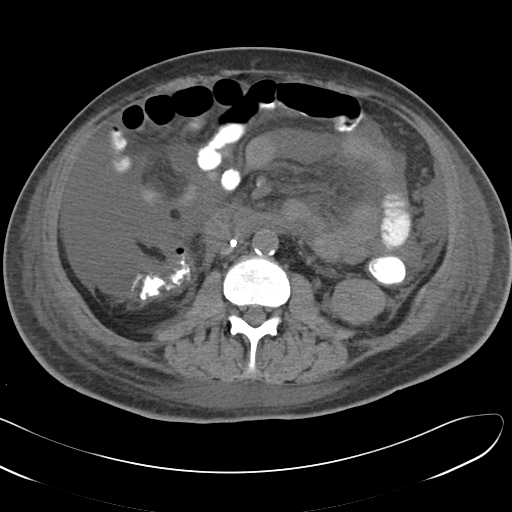
[im 67/101  soft-tissue]
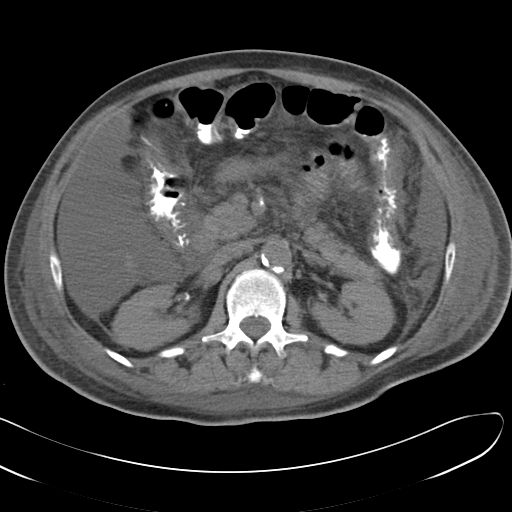
[im 67/101  bone]
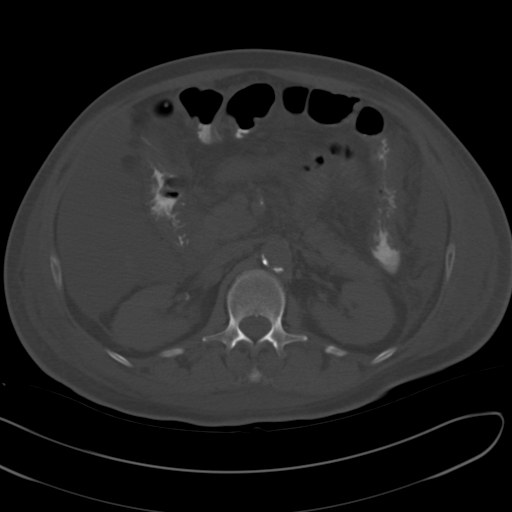
[im 71/101  soft-tissue]
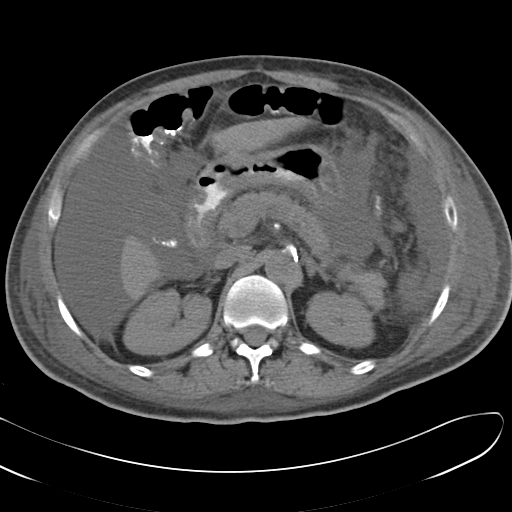
[im 80/101  soft-tissue]
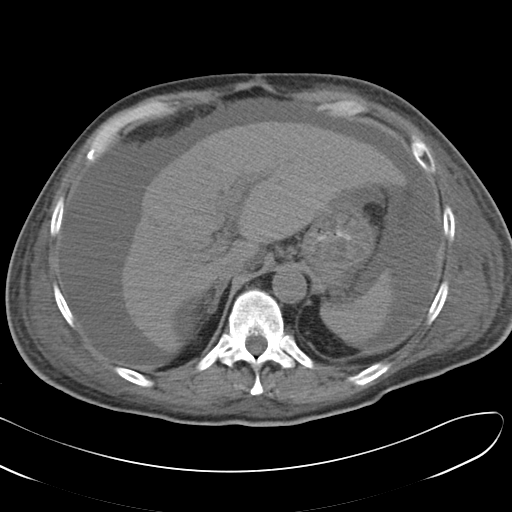
[im 88/101  soft-tissue]
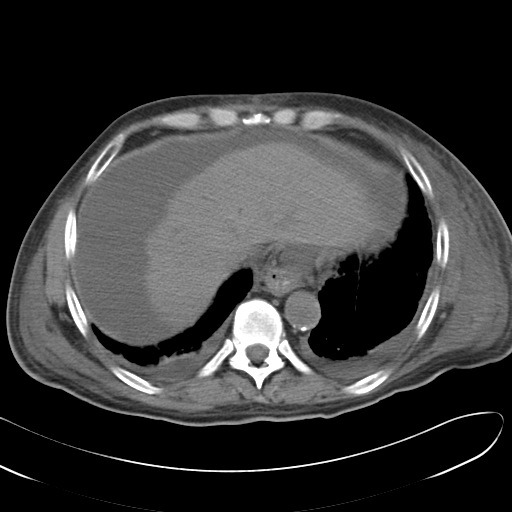
[im 96/101  soft-tissue]
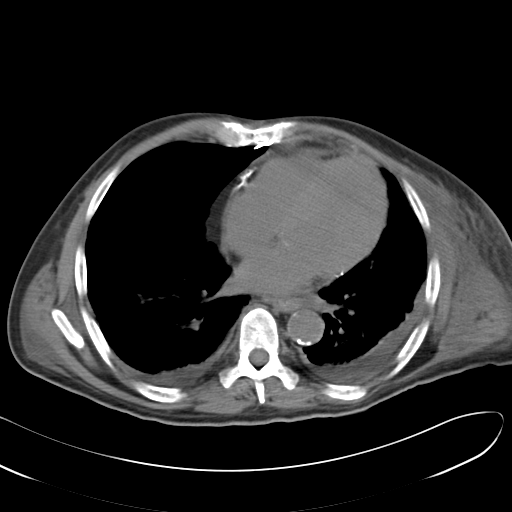

[Series 5: cor routine abd pel wo · coronal · 0.72mm/px · 3 of 142 slices shown]
[im 48/142  soft-tissue]
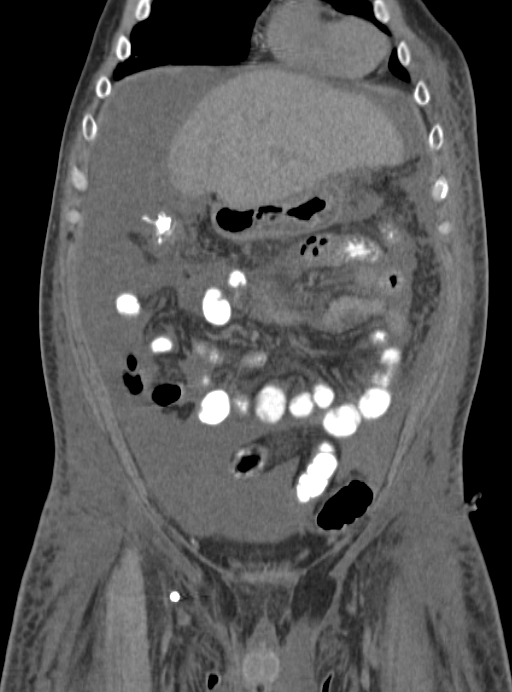
[im 63/142  soft-tissue]
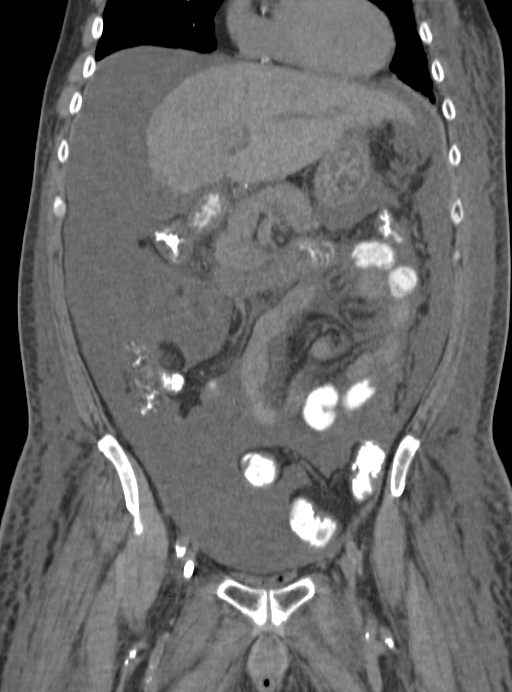
[im 79/142  soft-tissue]
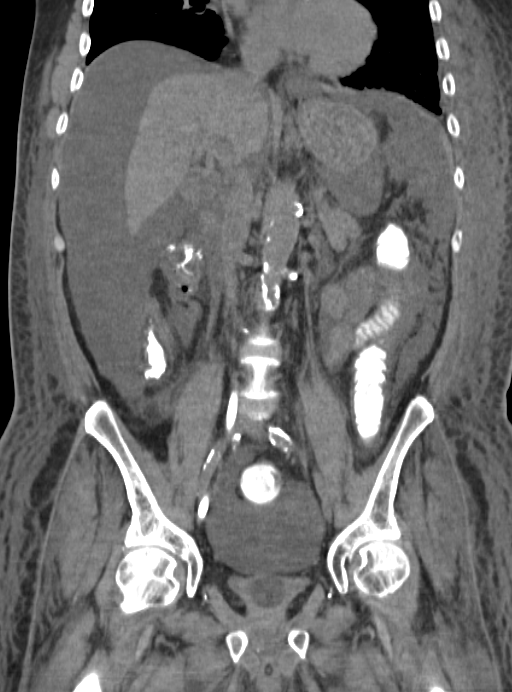

[16 of 46 positions shown; findings below may reference images not displayed]

FINDINGS: Lung bases demonstrate bibasilar atelectatic changes left slightly
greater than right. Small pleural effusions are noted bilaterally.

The liver is somewhat shrunken and nodular consistent with
underlying cirrhotic change. A moderate amount of ascites is noted.
The gallbladder is well distended and demonstrates multiple
gallstones stable from the prior exam. The spleen, pancreas and
adrenal glands are within normal limits.

The kidneys are within normal limits without hydronephrotic change.
Diffuse aortic calcifications are seen. Wall thickening is again
noted in the ascending colon and cecum stable from the prior exam.
The bladder is decompressed by Foley catheter. A central venous line
is noted in the right common femoral vein. Diffuse 3rd spacing of
fluid into the subcutaneous soft tissues is noted as well.
IMPRESSION: Changes of ascites and anasarca as well as cirrhosis of the liver.

Cholelithiasis without complicating factors.

Stable wall thickening within the ascending colon.

Small bilateral pleural effusions and bibasilar atelectatic changes.

## 2015-07-21 IMAGING — CR DG CHEST 1V PORT
1 series · 1 of 1 positions shown · non-contrast
Comparison: 02/22/2009

CLINICAL DATA: Hepatic and renal failure.

EXAM:
PORTABLE CHEST - 1 VIEW

[AP]
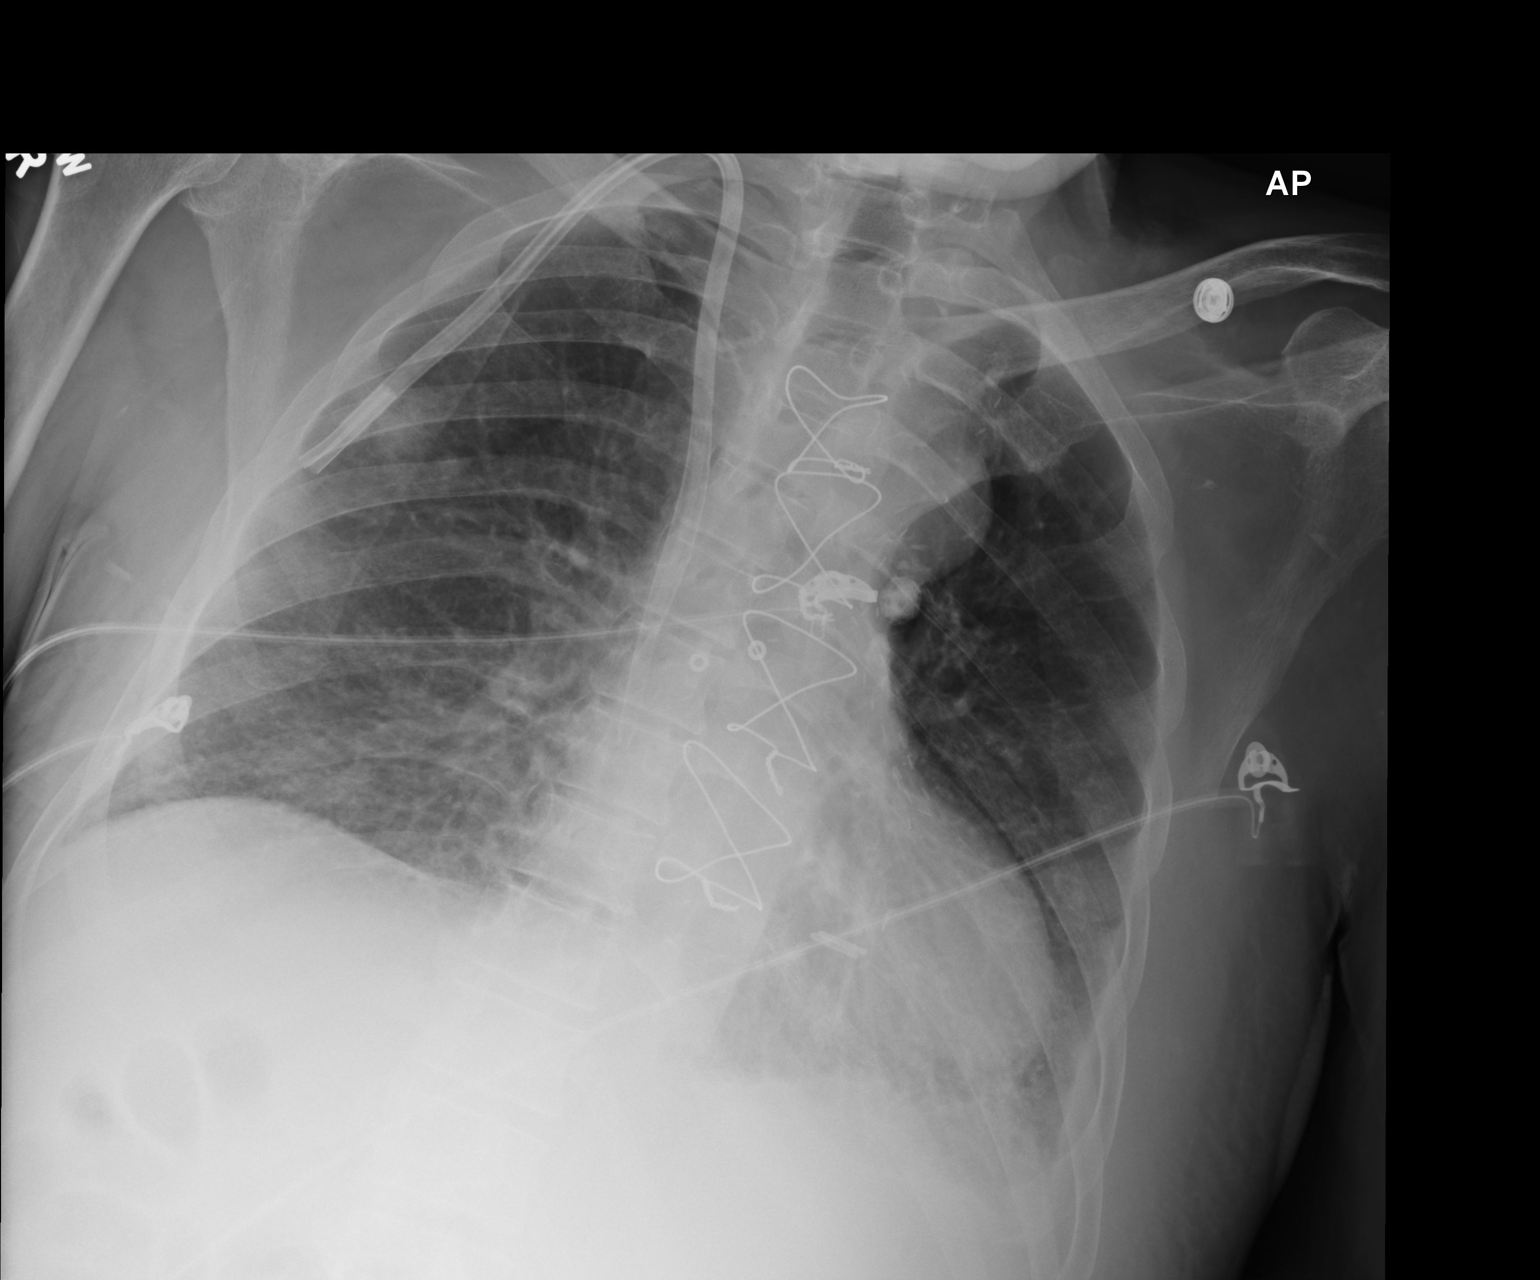

[1 of 1 positions shown; findings below may reference images not displayed]

FINDINGS: Right-sided tunneled dialysis catheter extends to the cavoatrial
junction. Lungs showed bibasilar atelectasis without overt focal
airspace consolidation or pulmonary edema. There may be a small left
pleural effusion versus chronic pleural thickening. The heart size
is normal. The patient has had interval CABG.
IMPRESSION: Bibasilar atelectasis and small left pleural effusion versus pleural
thickening.
